# Patient Record
Sex: Female | Born: 1985 | Hispanic: Yes | Smoking: Former smoker
Health system: Southern US, Community
[De-identification: ages and names within clinical notes are randomized; demographics above are authoritative.]

## PROBLEM LIST (undated history)

## (undated) ENCOUNTER — Inpatient Hospital Stay (HOSPITAL_COMMUNITY): Payer: Self-pay

## (undated) DIAGNOSIS — O009 Unspecified ectopic pregnancy without intrauterine pregnancy: Secondary | ICD-10-CM

## (undated) DIAGNOSIS — D649 Anemia, unspecified: Secondary | ICD-10-CM

## (undated) DIAGNOSIS — E119 Type 2 diabetes mellitus without complications: Secondary | ICD-10-CM

## (undated) DIAGNOSIS — F419 Anxiety disorder, unspecified: Secondary | ICD-10-CM

## (undated) HISTORY — DX: Unspecified ectopic pregnancy without intrauterine pregnancy: O00.90

## (undated) HISTORY — DX: Type 2 diabetes mellitus without complications: E11.9

## (undated) HISTORY — PX: ABDOMINAL SURGERY: SHX537

---
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
## 2012-06-15 DIAGNOSIS — O24419 Gestational diabetes mellitus in pregnancy, unspecified control: Secondary | ICD-10-CM

## 2015-09-21 DIAGNOSIS — O009 Unspecified ectopic pregnancy without intrauterine pregnancy: Secondary | ICD-10-CM

## 2015-09-21 HISTORY — DX: Unspecified ectopic pregnancy without intrauterine pregnancy: O00.90

## 2016-09-20 HISTORY — PX: WISDOM TOOTH EXTRACTION: SHX21

## 2016-11-10 DIAGNOSIS — R58 Hemorrhage, not elsewhere classified: Secondary | ICD-10-CM

## 2017-04-06 ENCOUNTER — Inpatient Hospital Stay (HOSPITAL_COMMUNITY)
Admission: AD | Admit: 2017-04-06 | Discharge: 2017-04-06 | Disposition: A | Discharge status: Home / Self Care | Payer: Medicaid Other | Source: Ambulatory Visit | Attending: Family Medicine | Admitting: Family Medicine

## 2017-04-06 ENCOUNTER — Encounter (HOSPITAL_COMMUNITY): Payer: Self-pay

## 2017-04-06 DIAGNOSIS — Z3A01 Less than 8 weeks gestation of pregnancy: Secondary | ICD-10-CM | POA: Diagnosis not present

## 2017-04-06 DIAGNOSIS — O98811 Other maternal infectious and parasitic diseases complicating pregnancy, first trimester: Secondary | ICD-10-CM | POA: Insufficient documentation

## 2017-04-06 DIAGNOSIS — O4691 Antepartum hemorrhage, unspecified, first trimester: Secondary | ICD-10-CM | POA: Diagnosis not present

## 2017-04-06 DIAGNOSIS — B373 Candidiasis of vulva and vagina: Secondary | ICD-10-CM | POA: Diagnosis not present

## 2017-04-06 DIAGNOSIS — O209 Hemorrhage in early pregnancy, unspecified: Secondary | ICD-10-CM

## 2017-04-06 LAB — URINALYSIS, ROUTINE W REFLEX MICROSCOPIC
BILIRUBIN URINE: NEGATIVE
Glucose, UA: NEGATIVE mg/dL
Ketones, ur: NEGATIVE mg/dL
NITRITE: NEGATIVE
PH: 5 (ref 5.0–8.0)
Protein, ur: NEGATIVE mg/dL
SPECIFIC GRAVITY, URINE: 1.03 (ref 1.005–1.030)

## 2017-04-06 LAB — POCT PREGNANCY, URINE: Preg Test, Ur: POSITIVE — AB

## 2017-04-06 LAB — WET PREP, GENITAL
CLUE CELLS WET PREP: NONE SEEN
Sperm: NONE SEEN
Trich, Wet Prep: NONE SEEN

## 2017-04-06 LAB — HCG, QUANTITATIVE, PREGNANCY: hCG, Beta Chain, Quant, S: 17734 m[IU]/mL — ABNORMAL HIGH (ref <5)

## 2017-04-06 MED ORDER — FLUCONAZOLE 150 MG PO TABS: 150.0000 mg | ORAL_TABLET | Freq: Once | ORAL | 0 refills | Status: AC

## 2017-04-06 MED ORDER — VITAFOL ULTRA 29-0.6-0.4-200 MG PO CAPS: 1.0000 | ORAL_CAPSULE | Freq: Every day | ORAL | 12 refills | Status: DC

## 2017-04-06 NOTE — Discharge Instructions (Signed)

## 2017-04-06 NOTE — MAU Note (Signed)
Pt has had some bleeding on and off the last few days. Started again today and only when she wipes.

## 2017-04-06 NOTE — MAU Provider Note (Signed)
  History     CSN: [id number]  Arrival date and time: 04/06/17 1527   First Provider Initiated Contact with Patient 04/06/17 1612      Chief Complaint  Patient presents with  . Vaginal Bleeding   HPI 31 yo [id number] at 5 weeks by LMP here for evaluation of vaginal bleeding in first trimester. Patient reports that this has been going on and off over the past few days. She was seen at [facility] 2 days ago with the same complaints. She reports an ultrasound was performed which demonstrated an intrauterine pregnancy without a visible fetus. She reports vaginal intercourse yesterday. She states that although the vaginal bleeding was intermittent, it stopped but started again today. She describes it as light and only visible when she wipes. She also endorses some lower pelvic cramping pain. Patient has not started prenatal care yet. This was an unplanned pregnancy (result of failed IUD) but it is desired.  OB History    Gravida Para Term Preterm AB Living   7 3 3   3      SAB TAB Ectopic Multiple Live Births   2   1          Past Medical History:  Diagnosis Date  . Gestational diabetes   . Pregnancy induced hypertension     History reviewed. No pertinent surgical history.  History reviewed. No pertinent family history.  Social History  Substance Use Topics  . Smoking status: Never Smoker  . Smokeless tobacco: Never Used  . Alcohol use No    Allergies: Allergies not on file  No prescriptions prior to admission.    Review of Systems  See pertinent in HPI Physical Exam   Blood pressure 99/75, pulse 77, temperature 98 F (36.7 C), temperature source Oral, resp. rate 18, last menstrual period 02/06/2017, SpO2 100 %.  Physical Exam GENERAL: Well-developed, well-nourished female in no acute distress.  ABDOMEN: Soft, nontender, nondistended.  PELVIC: Normal external female genitalia. Vagina is pink and rugated.  Normal discharge. Normal appearing cervix. Uterus is  normal in size. No adnexal mass or tenderness. No blood in vaginal vault or active bleeding EXTREMITIES: No cyanosis, clubbing, or edema, 2+ distal pulses.  MAU Course  Procedures  MDM Wet prep- yeast infection Quant HCG  Assessment and Plan  31 yo [id number] with vaginal bleeding in first trimester - Reassurance provided - Quant HCG today 17700 (increased from her reported 9000 2 days ago) - outpatient ultrasound ordered in 7 days for viability - Rx diflucan and prenatal vitamins provided  Chandan Fly 04/06/2017, 4:19 PM

## 2017-04-07 LAB — GC/CHLAMYDIA PROBE AMP (~~LOC~~) NOT AT ARMC
Chlamydia: NEGATIVE
NEISSERIA GONORRHEA: NEGATIVE

## 2017-04-14 ENCOUNTER — Ambulatory Visit (HOSPITAL_COMMUNITY)
Admission: RE | Admit: 2017-04-14 | Discharge: 2017-04-14 | Disposition: A | Discharge status: Home / Self Care | Payer: Medicaid Other | Source: Ambulatory Visit | Attending: Obstetrics and Gynecology | Admitting: Obstetrics and Gynecology

## 2017-04-14 ENCOUNTER — Other Ambulatory Visit (HOSPITAL_COMMUNITY): Payer: Self-pay | Admitting: Obstetrics and Gynecology

## 2017-04-14 DIAGNOSIS — O209 Hemorrhage in early pregnancy, unspecified: Secondary | ICD-10-CM | POA: Diagnosis present

## 2017-04-14 DIAGNOSIS — Z3A01 Less than 8 weeks gestation of pregnancy: Secondary | ICD-10-CM | POA: Insufficient documentation

## 2017-04-15 ENCOUNTER — Telehealth: Payer: Self-pay | Admitting: *Deleted

## 2017-04-15 NOTE — Telephone Encounter (Signed)
Attempted to call patient at all numbers listed. Just heard busy tone repeatedly. Will attempt to call her again on Monday.

## 2017-04-15 NOTE — Telephone Encounter (Signed)
Patient called and stated that she had an ultrasound yesterday and would like the results.

## 2017-04-18 ENCOUNTER — Encounter: Payer: Self-pay | Admitting: General Practice

## 2017-05-05 ENCOUNTER — Encounter: Payer: Self-pay | Admitting: Certified Nurse Midwife

## 2017-05-05 ENCOUNTER — Ambulatory Visit (INDEPENDENT_AMBULATORY_CARE_PROVIDER_SITE_OTHER): Payer: Medicaid Other | Admitting: Certified Nurse Midwife

## 2017-05-05 VITALS — BP 114/75 | HR 63 | Ht 61.0 in | Wt 204.0 lb

## 2017-05-05 DIAGNOSIS — O0990 Supervision of high risk pregnancy, unspecified, unspecified trimester: Secondary | ICD-10-CM | POA: Insufficient documentation

## 2017-05-05 DIAGNOSIS — O0992 Supervision of high risk pregnancy, unspecified, second trimester: Secondary | ICD-10-CM | POA: Diagnosis not present

## 2017-05-05 DIAGNOSIS — O09292 Supervision of pregnancy with other poor reproductive or obstetric history, second trimester: Secondary | ICD-10-CM

## 2017-05-05 DIAGNOSIS — O219 Vomiting of pregnancy, unspecified: Secondary | ICD-10-CM

## 2017-05-05 DIAGNOSIS — O09291 Supervision of pregnancy with other poor reproductive or obstetric history, first trimester: Secondary | ICD-10-CM | POA: Insufficient documentation

## 2017-05-05 DIAGNOSIS — Z8632 Personal history of gestational diabetes: Secondary | ICD-10-CM | POA: Insufficient documentation

## 2017-05-05 MED ORDER — PRENATE PIXIE 10-0.6-0.4-200 MG PO CAPS: 1.0000 | ORAL_CAPSULE | Freq: Every day | ORAL | 12 refills | Status: DC

## 2017-05-05 MED ORDER — ASPIRIN 81 MG PO CHEW: 81.0000 mg | CHEWABLE_TABLET | Freq: Every day | ORAL | 12 refills | Status: DC

## 2017-05-05 MED ORDER — DOXYLAMINE-PYRIDOXINE 10-10 MG PO TBEC: DELAYED_RELEASE_TABLET | ORAL | 4 refills | Status: DC

## 2017-05-05 NOTE — Progress Notes (Signed)
Anitbody S

## 2017-05-05 NOTE — Progress Notes (Signed)
Subjective:    Jamie Riggs is being seen today for her first obstetrical visit.  This is not a planned pregnancy, Had IUD that fell out at start of pregnancy. She is at 6231w2d gestation. Her obstetrical history is significant for obesity and Pre-eclampsia, GDM, close spaced pregnancies, antibody S.  Hx of ectopic pregnancy, has US with IUP in records. Relationship with FOB: spouse, living together. Patient does intend to breast feed. Pregnancy history fully reviewed.  The information documented in the HPI was reviewed and verified.  Menstrual History: OB History    Gravida Para Term Preterm AB Living   6 3 3   2 3    SAB TAB Ectopic Multiple Live Births   1   1   3        Patient's last menstrual period was 02/22/2017.    Past Medical History:  Diagnosis Date  . Ectopic pregnancy 2017  . Gestational diabetes   . Pregnancy induced hypertension     Past Surgical History:  Procedure Laterality Date  . WISDOM TOOTH EXTRACTION  2018     (Not in a hospital admission) Not on File  Social History  Substance Use Topics  . Smoking status: Never Smoker  . Smokeless tobacco: Never Used  . Alcohol use No    Family History  Problem Relation Age of Onset  . Diabetes Mother   . Hypertension Father   . Diabetes Maternal Grandmother   . Cancer Paternal Grandmother      Review of Systems Constitutional: negative for weight loss Gastrointestinal: negative for vomiting, + nausea  Genitourinary:negative for genital lesions and vaginal discharge and dysuria Musculoskeletal:negative for back pain Behavioral/Psych: negative for abusive relationship, depression, illegal drug usage and tobacco use    Objective:    BP 114/75   Pulse 63   Ht 5\' 1"  (1.549 m)   Wt 204 lb (92.5 kg)   LMP 02/22/2017   BMI 38.55 kg/m  General Appearance:    Alert, cooperative, no distress, appears stated age  Head:    Normocephalic, without obvious abnormality, atraumatic  Eyes:    PERRL,  conjunctiva/corneas clear, EOM's intact, fundi    benign, both eyes  Ears:    Normal TM's and external ear canals, both ears  Nose:   Nares normal, septum midline, mucosa normal, no drainage    or sinus tenderness  Throat:   Lips, mucosa, and tongue normal; teeth and gums normal  Neck:   Supple, symmetrical, trachea midline, no adenopathy;    thyroid:  no enlargement/tenderness/nodules; no carotid   bruit or JVD  Back:     Symmetric, no curvature, ROM normal, no CVA tenderness  Lungs:     Clear to auscultation bilaterally, respirations unlabored  Chest Wall:    No tenderness or deformity   Heart:    Regular rate and rhythm, S1 and S2 normal, no murmur, rub   or gallop  Breast Exam:    No tenderness, masses, or nipple abnormality  Abdomen:     Soft, non-tender, bowel sounds active all four quadrants,    no masses, no organomegaly  Genitalia:    Normal female without lesion, discharge or tenderness  Extremities:   Extremities normal, atraumatic, no cyanosis or edema  Pulses:   2+ and symmetric all extremities  Skin:   Skin color, texture, turgor normal, no rashes or lesions  Lymph nodes:   Cervical, supraclavicular, and axillary nodes normal  Neurologic:   CNII-XII intact, normal strength, sensation and reflexes  throughout           Lab Review Urine pregnancy test Labs reviewed yes Radiologic studies reviewed yes Assessment:    Pregnancy at [redacted]w[redacted]d weeks    Plan:      Prenatal vitamins.  Counseling provided regarding continued use of seat belts, cessation of alcohol consumption, smoking or use of illicit drugs; infection precautions i.e., influenza/TDAP immunizations, toxoplasmosis,CMV, parvovirus, listeria and varicella; workplace safety, exercise during pregnancy; routine dental care, safe medications, sexual activity, hot tubs, saunas, pools, travel, caffeine use, fish and methlymercury, potential toxins, hair treatments, varicose veins Weight gain recommendations per IOM  guidelines reviewed: underweight/BMI< 18.5--> gain 28 - 40 lbs; normal weight/BMI 18.5 - 24.9--> gain 25 - 35 lbs; overweight/BMI 25 - 29.9--> gain 15 - 25 lbs; obese/BMI >30->gain  11 - 20 lbs Problem list reviewed and updated. FIRST/CF mutation testing/NIPT/QUAD SCREEN/fragile X/Ashkenazi Jewish population testing/Spinal muscular atrophy discussed: requested. Role of ultrasound in pregnancy discussed; fetal survey: ordered. Amniocentesis discussed: not discussed.  Meds ordered this encounter  Medications  . aspirin 81 MG chewable tablet    Sig: Chew 1 tablet (81 mg total) by mouth daily.    Dispense:  30 tablet    Refill:  12  . Prenat-FeAsp-Meth-FA-DHA w/o A (PRENATE PIXIE) 10-0.6-0.4-200 MG CAPS    Sig: Take 1 tablet by mouth daily.    Dispense:  30 capsule    Refill:  12    Please process coupon: Rx BIN: [id number], RxPCN: [id number], RxGRP: [id number], RxID: [id number]  SUF: 01  . Doxylamine-Pyridoxine (DICLEGIS) 10-10 MG TBEC    Sig: Take 1 tablet with breakfast and lunch.  Take 2 tablets at bedtime.    Dispense:  100 tablet    Refill:  4   Orders Placed This Encounter  Procedures  . Culture, OB Urine  . [place] MFM Fetal Nuchal Translucency    Standing Status:   Future    Standing Expiration Date:   07/05/2018    Order Specific Question:   Reason for Exam (SYMPTOM  OR DIAGNOSIS REQUIRED)    Answer:   Anti S, hx of Pre-e and GDM    Order Specific Question:   Preferred imaging location?    Answer:   [facility]  . [place] MFM OB DETAIL +14 WK    Standing Status:   Future    Standing Expiration Date:   07/05/2018    Order Specific Question:   Reason for Exam (SYMPTOM  OR DIAGNOSIS REQUIRED)    Answer:   fetal anatomy scan    Order Specific Question:   Preferred imaging location?    Answer:   [facility]  . Hemoglobinopathy evaluation  . Varicella zoster antibody, IgG  . Hemoglobin A1c  . Obstetric Panel, Including HIV  . Cystic Fibrosis Mutation 97  . AMB referral to maternal  fetal medicine    Referral Priority:   Routine    Referral Type:   Consultation    Referral Reason:   Specialty Services Required    Number of Visits Requested:   1  . AMB MFM GENETICS REFERRAL    Referral Priority:   Routine    Referral Type:   Consultation    Referral Reason:   Specialty Services Required    Number of Visits Requested:   1    Follow up in 4 weeks. 50% of 30 min visit spent on counseling and coordination of care.

## 2017-05-07 LAB — CULTURE, OB URINE

## 2017-05-07 LAB — URINE CULTURE, OB REFLEX

## 2017-05-12 ENCOUNTER — Other Ambulatory Visit: Payer: Self-pay | Admitting: Certified Nurse Midwife

## 2017-05-12 DIAGNOSIS — O0990 Supervision of high risk pregnancy, unspecified, unspecified trimester: Secondary | ICD-10-CM

## 2017-05-12 LAB — CYSTIC FIBROSIS MUTATION 97: Interpretation: NOT DETECTED

## 2017-05-17 ENCOUNTER — Other Ambulatory Visit: Payer: Self-pay | Admitting: Certified Nurse Midwife

## 2017-05-17 DIAGNOSIS — R7689 Other specified abnormal immunological findings in serum: Secondary | ICD-10-CM | POA: Insufficient documentation

## 2017-05-17 DIAGNOSIS — D649 Anemia, unspecified: Secondary | ICD-10-CM | POA: Insufficient documentation

## 2017-05-17 DIAGNOSIS — R768 Other specified abnormal immunological findings in serum: Secondary | ICD-10-CM | POA: Insufficient documentation

## 2017-05-17 LAB — OBSTETRIC PANEL, INCLUDING HIV
BASOS: 0 %
Basophils Absolute: 0 10*3/uL (ref 0.0–0.2)
EOS (ABSOLUTE): 0.1 10*3/uL (ref 0.0–0.4)
EOS: 1 %
HEMATOCRIT: 35.1 % (ref 34.0–46.6)
HEMOGLOBIN: 10.5 g/dL — AB (ref 11.1–15.9)
HIV SCREEN 4TH GENERATION: NONREACTIVE
Hepatitis B Surface Ag: NEGATIVE
IMMATURE GRANULOCYTES: 0 %
Immature Grans (Abs): 0 10*3/uL (ref 0.0–0.1)
LYMPHS ABS: 2.8 10*3/uL (ref 0.7–3.1)
LYMPHS: 30 %
MCH: 23.3 pg — ABNORMAL LOW (ref 26.6–33.0)
MCHC: 29.9 g/dL — AB (ref 31.5–35.7)
MCV: 78 fL — ABNORMAL LOW (ref 79–97)
MONOCYTES: 6 %
MONOS ABS: 0.5 10*3/uL (ref 0.1–0.9)
Neutrophils Absolute: 5.9 10*3/uL (ref 1.4–7.0)
Neutrophils: 63 %
Platelets: 320 10*3/uL (ref 150–379)
RBC: 4.51 x10E6/uL (ref 3.77–5.28)
RDW: 20.1 % — ABNORMAL HIGH (ref 12.3–15.4)
RH TYPE: POSITIVE
RPR Ser Ql: NONREACTIVE
Rubella Antibodies, IGG: 2.67 index (ref >0.99)
WBC: 9.4 10*3/uL (ref 3.4–10.8)

## 2017-05-17 LAB — VARICELLA ZOSTER ANTIBODY, IGG: Varicella zoster IgG: 1736 index (ref >165)

## 2017-05-17 LAB — HEMOGLOBIN A1C
Est. average glucose Bld gHb Est-mCnc: 114 mg/dL
HEMOGLOBIN A1C: 5.6 % (ref 4.8–5.6)

## 2017-05-17 LAB — HEMOGLOBINOPATHY EVALUATION
HEMOGLOBIN A2 QUANTITATION: 1.4 % — AB (ref 1.8–3.2)
HEMOGLOBIN F QUANTITATION: 0 % (ref 0.0–2.0)
HGB A: 98.6 % (ref 96.4–98.8)
HGB C: 0 %
HGB S: 0 %
HGB VARIANT: 0 %

## 2017-05-17 LAB — AB SCR+ANTIBODY ID: Antibody Screen: POSITIVE — AB

## 2017-05-27 ENCOUNTER — Encounter (HOSPITAL_COMMUNITY): Payer: Medicaid Other

## 2017-05-27 ENCOUNTER — Other Ambulatory Visit: Payer: Self-pay | Admitting: Certified Nurse Midwife

## 2017-05-27 ENCOUNTER — Ambulatory Visit (HOSPITAL_COMMUNITY)
Admission: RE | Admit: 2017-05-27 | Discharge: 2017-05-27 | Disposition: A | Discharge status: Home / Self Care | Payer: Medicaid Other | Source: Ambulatory Visit | Attending: Obstetrics & Gynecology | Admitting: Obstetrics & Gynecology

## 2017-05-27 ENCOUNTER — Encounter (HOSPITAL_COMMUNITY): Payer: Self-pay

## 2017-05-27 ENCOUNTER — Ambulatory Visit (HOSPITAL_COMMUNITY)
Admission: RE | Admit: 2017-05-27 | Discharge: 2017-05-27 | Disposition: A | Discharge status: Home / Self Care | Payer: Medicaid Other | Source: Ambulatory Visit | Attending: Certified Nurse Midwife | Admitting: Certified Nurse Midwife

## 2017-05-27 DIAGNOSIS — O361910 Maternal care for other isoimmunization, first trimester, not applicable or unspecified: Secondary | ICD-10-CM | POA: Insufficient documentation

## 2017-05-27 DIAGNOSIS — E669 Obesity, unspecified: Secondary | ICD-10-CM | POA: Diagnosis not present

## 2017-05-27 DIAGNOSIS — O99211 Obesity complicating pregnancy, first trimester: Secondary | ICD-10-CM

## 2017-05-27 DIAGNOSIS — Z3682 Encounter for antenatal screening for nuchal translucency: Secondary | ICD-10-CM

## 2017-05-27 DIAGNOSIS — Z8632 Personal history of gestational diabetes: Principal | ICD-10-CM

## 2017-05-27 DIAGNOSIS — O09291 Supervision of pregnancy with other poor reproductive or obstetric history, first trimester: Secondary | ICD-10-CM | POA: Diagnosis not present

## 2017-05-27 DIAGNOSIS — Z3A13 13 weeks gestation of pregnancy: Secondary | ICD-10-CM | POA: Insufficient documentation

## 2017-05-27 DIAGNOSIS — O361110 Maternal care for Anti-A sensitization, first trimester, not applicable or unspecified: Secondary | ICD-10-CM

## 2017-05-27 DIAGNOSIS — O0990 Supervision of high risk pregnancy, unspecified, unspecified trimester: Secondary | ICD-10-CM

## 2017-05-27 NOTE — Progress Notes (Signed)
Maternal Fetal Medicine Consultation  Requesting Provider(s):Denney, CNM Primary OB:[facility] Reason for consultation: History of Anti-S isoimmunization, gestational diabetes and preeclampsia in previous pregnancies  HPI:31yo P3023 at 13+3 weeks referred for the above problems. She had gestational diabetes during her second term pregnancy, with a baby that was born at 7 pounds 12 ounces and had no neonatal hypoglycemia. In her third term pregnancy (that delivered c. 7 months ago) there was no GDM. She had a recent HgbA1C which was 5.6% During her first pregnancy she had term preeclampsia that was mild. She did not have preeclampsia in her second and third term pregnancies. This pregnancy is with a new partner. She has already started low-dose aspirin. During her last pregnancy she had a positive antibody screen with Anti-S identified. Titers were never greater than 1, and the baby had neither hydrops prenatally nor hyperbilirubinemia postnatally. Her labs from [facility] showed a positve antibody screen but the antibody ID was nonspecific. OB History: OB History    Gravida Para Term Preterm AB Living   6 3 3   2 3    SAB TAB Ectopic Multiple Live Births   1   1   3       PMH:  Past Medical History:  Diagnosis Date  . Ectopic pregnancy 2017  . Gestational diabetes   . Pregnancy induced hypertension     PSH:  Past Surgical History:  Procedure Laterality Date  . WISDOM TOOTH EXTRACTION  2018   Meds: low-dose ASA, PNV Allergies: NKDA FH: See EPIC section Soc: See EPIC section  Review of Systems: no vaginal bleeding or cramping/contractions, no LOF, no nausea/vomiting. All other systems reviewed and are negative.  PE:  VS: See EPIC section GEN: well-appearing female ABD: gravid, NT  Please see separate document for fetal ultrasound report.  A/P: 1. History of preeclampsia in a previous pregnancy. This follows the classic pattern for nonrecurrent preeclampsia: mild, at term, and  occurring in a primigravida. I agree with starting the aspirin. I do not believe baseline labs are necessary. She does have a slightly higher risk for recurrence that what we would expect in this situation due to the "reset" effect seen with a new FOB. 2. History of gestational diabetes ina previous pregnancy. The normal HgbA1C and the fact that she did not have GDM in her last pregnancy reassures me that we do not need early intervention. I would suggest getting her 1 hour GTT at 26 weeks 3. 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 The most important task is identification of the antibody and obtaining titers. We will have blood drawn and sent to blood bank today. If titers remain <8, then no intervention will be necessary as she has had no previously affected children. To this end, if initial antibody ID and titers are reassuring, then I would get titers drawn q4 weeks during pregnancy. The need to be sent to the same lab for evaluation, in this case the [facility] bank. At this time no further visits have been scheduled in MFM. We will contact you with the results of the antibody titers and first trimester screen labs as soon as they are available  Thank you for the opportunity to be a part of the care of Jamie Riggs. Please contact our office if we can be of further assistance.   I spent approximately 30 minutes with this patient with over 50% of time spent in face-to-face counseling.

## 2017-05-27 NOTE — Addendum Note (Signed)
Encounter addended by: Levonne HubertStalter, Sady Monaco M on: 05/27/2017 11:33 AM<BR>    Actions taken: Imaging Exam ended

## 2017-05-31 LAB — SPECIMEN STATUS REPORT

## 2017-05-31 LAB — ANTIBODY IDENTIFICATION

## 2017-06-04 ENCOUNTER — Other Ambulatory Visit: Payer: Self-pay | Admitting: Certified Nurse Midwife

## 2017-06-04 DIAGNOSIS — O0990 Supervision of high risk pregnancy, unspecified, unspecified trimester: Secondary | ICD-10-CM

## 2017-06-08 ENCOUNTER — Telehealth: Payer: Self-pay | Admitting: *Deleted

## 2017-06-08 ENCOUNTER — Other Ambulatory Visit: Payer: Self-pay | Admitting: Certified Nurse Midwife

## 2017-06-08 NOTE — Progress Notes (Signed)
Discussed lab results with patient and antibody identification.  Had antibody S with hemorrhage last pregnancy but did not require a blood transfusion.   R.Naji Mehringer CNM

## 2017-06-08 NOTE — Telephone Encounter (Signed)
Patient is calling about her results- she doesn't understand the comments and would like Rachelle to explain them to her.

## 2017-06-08 NOTE — Telephone Encounter (Signed)
Spoke with patient, see phone note.

## 2017-06-15 ENCOUNTER — Encounter: Payer: Self-pay | Admitting: Obstetrics & Gynecology

## 2017-06-15 ENCOUNTER — Ambulatory Visit (INDEPENDENT_AMBULATORY_CARE_PROVIDER_SITE_OTHER): Payer: Medicaid Other | Admitting: Obstetrics & Gynecology

## 2017-06-15 DIAGNOSIS — O09292 Supervision of pregnancy with other poor reproductive or obstetric history, second trimester: Secondary | ICD-10-CM

## 2017-06-15 DIAGNOSIS — O0990 Supervision of high risk pregnancy, unspecified, unspecified trimester: Secondary | ICD-10-CM

## 2017-06-15 DIAGNOSIS — O09299 Supervision of pregnancy with other poor reproductive or obstetric history, unspecified trimester: Secondary | ICD-10-CM | POA: Insufficient documentation

## 2017-06-15 DIAGNOSIS — O0992 Supervision of high risk pregnancy, unspecified, second trimester: Secondary | ICD-10-CM

## 2017-06-15 NOTE — Progress Notes (Signed)
   PRENATAL VISIT NOTE  Subjective:  Jamie Riggs is a 31 y.o. [phone] at [redacted]w[redacted]d being seen today for ongoing prenatal care.  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  Patient reports nausea.  Contractions: Not present. Vag. Bleeding: None.  Movement: Present. Denies leaking of fluid.   The following portions of the patient's history were reviewed and updated as appropriate: allergies, current medications, past family history, past medical history, past social history, past surgical history and problem list. Problem list updated.  Objective:   Vitals:   06/15/17 1332  BP: 123/75  Pulse: 92  Weight: 209 lb (94.8 kg)    Fetal Status: Fetal Heart Rate (bpm): 148   Movement: Present     General:  Alert, oriented and cooperative. Patient is in no acute distress.  Skin: Skin is warm and dry. No rash noted.   Cardiovascular: Normal heart rate noted  Respiratory: Normal respiratory effort, no problems with respiration noted  Abdomen: Soft, gravid, appropriate for gestational age.  Pain/Pressure: Absent     Pelvic: Cervical exam deferred        Extremities: Normal range of motion.     Mental Status:  Normal mood and affect. Normal behavior. Normal judgment and thought content.   Assessment and Plan:  Pregnancy: [id number] at [redacted]w[redacted]d  1. Supervision of high risk pregnancy, antepartum Routine screen - AFP, Serum, Open Spina Bifida  2. H/O postpartum hemorrhage, currently pregnant Has abnormal Ab screen - AFP, Serum, Open Spina Bifida  Preterm labor symptoms and general obstetric precautions including  but not limited to vaginal bleeding, contractions, leaking of fluid and fetal movement were reviewed in detail with the patient. Please refer to After Visit Summary for other counseling recommendations.  Return in about 4 weeks (around 07/13/2017).   Scheryl Darter, MD

## 2017-06-15 NOTE — Patient Instructions (Signed)

## 2017-06-20 LAB — AFP, SERUM, OPEN SPINA BIFIDA
AFP MOM: 0.79
AFP VALUE AFPOSL: 21.5 ng/mL
Gest. Age on Collection Date: 16.1 weeks
Maternal Age At EDD: 31.1 yr
OSBR RISK 1 IN: 10000
Test Results:: NEGATIVE
WEIGHT: 209 [lb_av]

## 2017-06-22 ENCOUNTER — Other Ambulatory Visit: Payer: Self-pay

## 2017-06-24 ENCOUNTER — Ambulatory Visit (INDEPENDENT_AMBULATORY_CARE_PROVIDER_SITE_OTHER): Payer: Medicaid Other | Admitting: Certified Nurse Midwife

## 2017-06-24 VITALS — BP 116/73 | HR 62 | Wt 209.0 lb

## 2017-06-24 DIAGNOSIS — R399 Unspecified symptoms and signs involving the genitourinary system: Secondary | ICD-10-CM

## 2017-06-24 DIAGNOSIS — O0990 Supervision of high risk pregnancy, unspecified, unspecified trimester: Secondary | ICD-10-CM

## 2017-06-24 DIAGNOSIS — O26892 Other specified pregnancy related conditions, second trimester: Secondary | ICD-10-CM

## 2017-06-24 DIAGNOSIS — R102 Pelvic and perineal pain: Secondary | ICD-10-CM

## 2017-06-24 DIAGNOSIS — M549 Dorsalgia, unspecified: Secondary | ICD-10-CM

## 2017-06-24 DIAGNOSIS — O9989 Other specified diseases and conditions complicating pregnancy, childbirth and the puerperium: Secondary | ICD-10-CM

## 2017-06-24 DIAGNOSIS — O26899 Other specified pregnancy related conditions, unspecified trimester: Secondary | ICD-10-CM

## 2017-06-24 DIAGNOSIS — O0992 Supervision of high risk pregnancy, unspecified, second trimester: Secondary | ICD-10-CM

## 2017-06-24 DIAGNOSIS — D649 Anemia, unspecified: Secondary | ICD-10-CM

## 2017-06-24 DIAGNOSIS — O99891 Other specified diseases and conditions complicating pregnancy: Secondary | ICD-10-CM

## 2017-06-24 MED ORDER — COMFORT FIT MATERNITY SUPP LG MISC: 1.0000 [IU] | Freq: Every day | 0 refills | Status: DC

## 2017-06-24 NOTE — Progress Notes (Signed)
Presents for lower back and abdominal pain, she is [redacted] weeks pregnant.  Denies fever, chills and vomiting. She sometimes get lightheaded and nausea. Burns whenever she pees.

## 2017-06-27 ENCOUNTER — Telehealth: Payer: Self-pay | Admitting: *Deleted

## 2017-06-27 ENCOUNTER — Encounter (HOSPITAL_COMMUNITY): Payer: Self-pay | Admitting: Certified Nurse Midwife

## 2017-06-27 ENCOUNTER — Other Ambulatory Visit: Payer: Self-pay | Admitting: Certified Nurse Midwife

## 2017-06-27 DIAGNOSIS — N949 Unspecified condition associated with female genital organs and menstrual cycle: Secondary | ICD-10-CM

## 2017-06-27 MED ORDER — CYCLOBENZAPRINE HCL 10 MG PO TABS: 10.0000 mg | ORAL_TABLET | Freq: Three times a day (TID) | ORAL | 1 refills | Status: DC | PRN

## 2017-06-27 NOTE — Telephone Encounter (Signed)
Patient called in ans stated she came in on Friday and was prescribed something for pain and when she went to the pharmacy nothing was there...please advise.Marland KitchenMarland Kitchen

## 2017-06-27 NOTE — Progress Notes (Signed)
   PRENATAL VISIT NOTE  Subjective:  Jamie Riggs is a 31 y.o. [phone] at [redacted]w[redacted]d being seen today for ongoing prenatal care.  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  Patient reports backache, no bleeding, no contractions, no cramping and no leaking.  Contractions: Not present. Vag. Bleeding: None.  Movement: Present. Denies leaking of fluid.   The following portions of the patient's history were reviewed and updated as appropriate: allergies, current medications, past family history, past medical history, past social history, past surgical history and problem list. Problem list updated.  Objective:   Vitals:   06/24/17 0941  BP: 116/73  Pulse: 62  Weight: 209 lb (94.8 kg)    Fetal Status: Fetal Heart Rate (bpm): 150; doppler   Movement: Present     General:  Alert, oriented and cooperative. Patient is in no acute distress.  Skin: Skin is warm and dry. No rash noted.   Cardiovascular: Normal heart rate noted  Respiratory: Normal respiratory effort, no problems with respiration noted  Abdomen: Soft, gravid, appropriate for gestational age.  Pain/Pressure: Present     Pelvic: Cervical exam deferred        Extremities: Normal range of motion.  Edema: Trace  Mental Status:  Normal mood and affect. Normal behavior. Normal judgment and thought content.   Assessment and Plan:  Pregnancy: [id number] at [redacted]w[redacted]d  1. UTI symptoms      - POCT Urinalysis Dipstick - Culture, OB Urine  2. Supervision of high risk pregnancy, antepartum       3. Low hemoglobin A2 level in blood     - Antibody  identification; Future - Antibody identification  4. Pain of round ligament during pregnancy     - Elastic Bandages & Supports (COMFORT FIT MATERNITY SUPP LG) MISC; 1 Units by Does not apply route daily.  Dispense: 1 each; Refill: 0  5. Back pain affecting pregnancy in second trimester    Homeopathic remedies discussed.  - Elastic Bandages & Supports (COMFORT FIT MATERNITY SUPP LG) MISC; 1 Units by Does not apply route daily.  Dispense: 1 each; Refill: 0  Preterm labor symptoms and general obstetric precautions including but not limited to vaginal bleeding, contractions, leaking of fluid and fetal movement were reviewed in detail with the patient. Please refer to After Visit Summary for other counseling recommendations.  Return in about 4 weeks (around 07/22/2017) for [facility].   Roe Coombs, CNM

## 2017-06-27 NOTE — Telephone Encounter (Signed)
Pt informed

## 2017-06-27 NOTE — Telephone Encounter (Signed)
Flexeril was sent to the pharmacy for her to try thank you.  Kinesha Auten

## 2017-06-28 LAB — CULTURE, OB URINE

## 2017-06-28 LAB — URINE CULTURE, OB REFLEX

## 2017-06-30 ENCOUNTER — Other Ambulatory Visit: Payer: Self-pay | Admitting: Certified Nurse Midwife

## 2017-06-30 DIAGNOSIS — R768 Other specified abnormal immunological findings in serum: Secondary | ICD-10-CM

## 2017-06-30 LAB — ANTIBODY IDENTIFICATION

## 2017-07-05 ENCOUNTER — Other Ambulatory Visit: Payer: Self-pay | Admitting: Certified Nurse Midwife

## 2017-07-05 ENCOUNTER — Other Ambulatory Visit (HOSPITAL_COMMUNITY): Payer: Self-pay | Admitting: *Deleted

## 2017-07-05 ENCOUNTER — Encounter (HOSPITAL_COMMUNITY): Payer: Self-pay

## 2017-07-05 ENCOUNTER — Ambulatory Visit (HOSPITAL_COMMUNITY)
Admission: RE | Admit: 2017-07-05 | Discharge: 2017-07-05 | Disposition: A | Discharge status: Home / Self Care | Payer: Medicaid Other | Source: Ambulatory Visit | Attending: Certified Nurse Midwife | Admitting: Certified Nurse Midwife

## 2017-07-05 DIAGNOSIS — O09892 Supervision of other high risk pregnancies, second trimester: Secondary | ICD-10-CM

## 2017-07-05 DIAGNOSIS — Z8632 Personal history of gestational diabetes: Secondary | ICD-10-CM

## 2017-07-05 DIAGNOSIS — O99212 Obesity complicating pregnancy, second trimester: Secondary | ICD-10-CM

## 2017-07-05 DIAGNOSIS — O0992 Supervision of high risk pregnancy, unspecified, second trimester: Secondary | ICD-10-CM | POA: Diagnosis present

## 2017-07-05 DIAGNOSIS — O09292 Supervision of pregnancy with other poor reproductive or obstetric history, second trimester: Secondary | ICD-10-CM

## 2017-07-05 DIAGNOSIS — IMO0002 Reserved for concepts with insufficient information to code with codable children: Secondary | ICD-10-CM

## 2017-07-05 DIAGNOSIS — O0990 Supervision of high risk pregnancy, unspecified, unspecified trimester: Secondary | ICD-10-CM

## 2017-07-05 DIAGNOSIS — E669 Obesity, unspecified: Secondary | ICD-10-CM | POA: Diagnosis not present

## 2017-07-05 DIAGNOSIS — Z3689 Encounter for other specified antenatal screening: Secondary | ICD-10-CM | POA: Diagnosis not present

## 2017-07-05 DIAGNOSIS — Z3A19 19 weeks gestation of pregnancy: Secondary | ICD-10-CM | POA: Diagnosis not present

## 2017-07-05 DIAGNOSIS — Z0489 Encounter for examination and observation for other specified reasons: Secondary | ICD-10-CM

## 2017-07-13 ENCOUNTER — Ambulatory Visit (INDEPENDENT_AMBULATORY_CARE_PROVIDER_SITE_OTHER): Payer: Medicaid Other | Admitting: Obstetrics & Gynecology

## 2017-07-13 VITALS — BP 112/71 | HR 71 | Wt 211.0 lb

## 2017-07-13 DIAGNOSIS — Z23 Encounter for immunization: Secondary | ICD-10-CM

## 2017-07-13 DIAGNOSIS — O09292 Supervision of pregnancy with other poor reproductive or obstetric history, second trimester: Secondary | ICD-10-CM

## 2017-07-13 DIAGNOSIS — O0990 Supervision of high risk pregnancy, unspecified, unspecified trimester: Secondary | ICD-10-CM

## 2017-07-13 DIAGNOSIS — O0992 Supervision of high risk pregnancy, unspecified, second trimester: Secondary | ICD-10-CM

## 2017-07-13 DIAGNOSIS — O09291 Supervision of pregnancy with other poor reproductive or obstetric history, first trimester: Secondary | ICD-10-CM

## 2017-07-13 NOTE — Progress Notes (Signed)
PRENATAL VISIT NOTE  Subjective:  Jamie Riggs is a 31 y.o. [phone] at [redacted]w[redacted]d being seen today for ongoing prenatal care.  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  Patient reports no complaints.  Contractions: Not present. Vag. Bleeding: None.  Movement: Present. Denies leaking of fluid.   The following portions of the patient's history were reviewed and updated as appropriate: allergies, current medications, past family history, past medical history, past social history, past surgical history and problem list. Problem list updated.  Objective:   Vitals:   07/13/17 0826  BP: 112/71  Pulse: 71  Weight: 211 lb (95.7 kg)    Fetal Status: Fetal Heart Rate (bpm): 147   Movement: Present     General:  Alert, oriented and cooperative. Patient is in no acute distress.  Skin: Skin is warm and dry. No rash noted.   Cardiovascular: Normal heart rate noted  Respiratory: Normal respiratory effort, no problems with respiration noted  Abdomen: Soft, gravid, appropriate for gestational age.  Pain/Pressure: Present     Pelvic: Cervical exam deferred        Extremities: Normal range of motion.  Edema: Trace  Mental Status:  Normal mood and affect. Normal behavior. Normal judgment and thought content.   [place] Mfm Ob Detail +14 Wk  Result Date: 07/05/2017 ----------------------------------------------------------------------  OBSTETRICS REPORT                      (Signed Final 07/05/2017 10:10 am) ---------------------------------------------------------------------- Patient Info  ID #:       [id number]                           D.O.B.:  June 29, 1986 (31 yrs)  Name:       Jamie Riggs                Visit Date: 07/05/2017 09:03 am ---------------------------------------------------------------------- Performed By  Performed By:     Hurman Horn          Ref. Address:     [facility]                    [facility]                                                             [facility]                                                             [address]                                                             [place], [place]                                                             [address]  Attending:        Ledon Snare MD         Location:         [facility]  Referred By:      [facility] for                    [facility]                    Healthcare ---------------------------------------------------------------------- Orders   #  Description                                 Code   1  [place] MFM OB DETAIL +14 WK                     46962.95  ----------------------------------------------------------------------   #  Ordered By               Order #        Accession #    Episode #   1  RACHELLE Marjo Bicker          [id number]      [id number]     [id number]  ---------------------------------------------------------------------- Indications   [redacted] weeks gestation of pregnancy                Z3A.19   Poor obstetric history: Previous gestational   O09.299   diabetes/PIH   Obesity complicating pregnancy, second         O99.212   trimester   Encounter for fetal anatomic survey            Z36.89   Maternal care for other isoimmunization,       O36.1920   second trimester, unspecified (anti-S)   Short interval between pregancies, 2nd         O09.892   trimester  ---------------------------------------------------------------------- OB History  Blood Type:            Height:  5'1"   Weight (lb):  198       BMI:  37.41  Gravidity:    6          Term:   3        Prem:   0        SAB:   1  TOP:          0       Ectopic:  1        Living: 3 ---------------------------------------------------------------------- Fetal Evaluation  Num Of Fetuses:     1  Fetal Heart  153  Rate(bpm):  Cardiac Activity:   Observed  Presentation:       Variable  Placenta:           Posterior, above cervical os  P. Cord Insertion:  Visualized, central  Amniotic Fluid  AFI FV:      Subjectively within normal limits                              Largest Pocket(cm)                              3.5 ---------------------------------------------------------------------- Biometry  BPD:      43.1  mm     G. Age:  19w 0d         52  %    CI:        72.66   %    70 - 86                                                          FL/HC:      17.5   %    16.1 - 18.3  HC:      160.8  mm     G. Age:  18w 6d         38  %    HC/AC:      1.09        1.09 - 1.39  AC:      147.4  mm     G. Age:  20w 0d         78  %    FL/BPD:     65.2   %  FL:       28.1  mm     G. Age:  18w 4d         30  %    FL/AC:      19.1   %    20 - 24  CER:      19.4  mm     G. Age:  18w 5d         41  %  NFT:       8.3  mm  CM:          6  mm  Est. FW:     285  gm    0 lb 10 oz      50  % ---------------------------------------------------------------------- Gestational Age  LMP:           19w 0d        Date:  02/22/17                 EDD:   11/29/17  U/S Today:     19w 1d                                        EDD:   11/28/17  Best:          19w 0d     Det. By:  LMP  (02/22/17)          EDD:   11/29/17 ----------------------------------------------------------------------  Anatomy  Cranium:               Appears normal         Aortic Arch:            Not well visualized  Cavum:                 Appears normal         Ductal Arch:            Appears normal  Ventricles:            Appears normal         Diaphragm:              Appears normal  Choroid Plexus:        Appears normal         Stomach:                Appears  normal, left                                                                        sided  Cerebellum:            Appears normal         Abdomen:                Appears normal  Posterior Fossa:       Not well visualized    Abdominal Wall:         Appears nml (cord                                                                        insert, abd wall)  Nuchal Fold:           Not well visualized    Cord Vessels:           Appears normal (3                                                                        vessel cord)  Face:                  Appears normal         Kidneys:                Appear normal                         (orbits and profile)  Lips:                  Not well visualized    Bladder:  Appears normal  Thoracic:              Appears normal         Spine:                  Appears normal  Heart:                 Not well visualized    Upper Extremities:      Appears normal  RVOT:                  Appears normal         Lower Extremities:      Appears normal  LVOT:                  Appears normal  Other:  Fetus appears to be a female. Nasal bone visualized.Technically          difficult due to maternal habitus. ---------------------------------------------------------------------- Cervix Uterus Adnexa  Cervix  Length:           5.32  cm.  Normal appearance by transabdominal scan.  Uterus  No abnormality visualized.  Left Ovary  Not visualized.  Right Ovary  Not visualized.  Adnexa:       No abnormality visualized. ---------------------------------------------------------------------- Impression  Singleton intrauterine pregnancy at 19 weeks 0 days  gestation with fetal cardiac activity  Variable presentation  Posterior placenta without evidence of previa  Normal appearing fetal growth and amniotic fluid volume  Limited fetal anatomic survey secondary to maternal habitus  Normal appearing cervical length ---------------------------------------------------------------------- Recommendations   Recommend follow-up ultrasound examination in  4 weeks  to complete fetal anatomic survey ----------------------------------------------------------------------                   Ledon SnareBrian Brost, MD Electronically Signed Final Report   07/05/2017 10:10 am ----------------------------------------------------------------------   Assessment and Plan:  Pregnancy: [id number] at 5243w1d  1. History of pre-eclampsia in prior pregnancy, currently pregnant in first trimester Stable BP, on ASA.  2. Flu vaccine need - Flu Vaccine QUAD 36+ mos IM (Fluarix, Quad PF)  3. Supervision of high risk pregnancy, antepartum Normal sequential screening, repeat anatomy scan scheduled. Preterm labor symptoms and general obstetric precautions including but not limited to vaginal bleeding, contractions, leaking of fluid and fetal movement were reviewed in detail with the patient. Please refer to After Visit Summary for other counseling recommendations.  Return in about 4 weeks (around 08/10/2017) for OB Visit.   Jaynie CollinsUgonna Alanii Ramer, MD

## 2017-07-13 NOTE — Patient Instructions (Addendum)
Return to clinic for any scheduled appointments or obstetric concerns, or go to [facility] for evaluation Influenza (Flu) Vaccine (Inactivated or Recombinant): What You Need to Know  1. Why get vaccinated? Influenza ("flu") is a contagious disease that spreads around the [place] every year, usually between October and May. Flu is caused by influenza viruses, and is spread mainly by coughing, sneezing, and close contact. Anyone can get flu. Flu strikes suddenly and can last several days. Symptoms vary by age, but can include:  fever/chills  sore throat  muscle aches  fatigue  cough  headache  runny or stuffy nose  Flu can also lead to pneumonia and blood infections, and cause diarrhea and seizures in children. If you have a medical condition, such as heart or lung disease, flu can make it worse. Flu is more dangerous for some people. Infants and young children, people 59 years of age and older, pregnant women, and people with certain health conditions or a weakened immune system are at greatest risk. Each year thousands of people in the [place] die from flu, and many more are hospitalized. Flu vaccine can:  keep you from getting flu,  make flu less severe if you do get it, and  keep you from spreading flu to your family and other people. 2. Inactivated and recombinant flu vaccines A dose of flu vaccine is recommended every flu season. Children 6 months through 97 years of age may need two doses during the same flu season. Everyone else needs only one dose each flu season. Some inactivated flu vaccines contain a very small amount of a mercury-based preservative called thimerosal. Studies have not shown thimerosal in vaccines to be harmful, but flu vaccines that do not contain thimerosal are available. There is no live flu virus in flu shots. They cannot cause the flu. There are many flu viruses, and they are always changing. Each year a new flu vaccine is made to protect against  three or four viruses that are likely to cause disease in the upcoming flu season. But even when the vaccine doesn't exactly match these viruses, it may still provide some protection. Flu vaccine cannot prevent:  flu that is caused by a virus not covered by the vaccine, or  illnesses that look like flu but are not.  It takes about 2 weeks for protection to develop after vaccination, and protection lasts through the flu season. 3. Some people should not get this vaccine Tell the person who is giving you the vaccine:  If you have any severe, life-threatening allergies. If you ever had a life-threatening allergic reaction after a dose of flu vaccine, or have a severe allergy to any part of this vaccine, you may be advised not to get vaccinated. Most, but not all, types of flu vaccine contain a small amount of egg protein.  If you ever had Guillain-Barr Syndrome (also called GBS). Some people with a history of GBS should not get this vaccine. This should be discussed with your doctor.  If you are not feeling well. It is usually okay to get flu vaccine when you have a mild illness, but you might be asked to come back when you feel better.  4. Risks of a vaccine reaction With any medicine, including vaccines, there is a chance of reactions. These are usually mild and go away on their own, but serious reactions are also possible. Most people who get a flu shot do not have any problems with it. Minor problems following a  flu shot include:  soreness, redness, or swelling where the shot was given  hoarseness  sore, red or itchy eyes  cough  fever  aches  headache  itching  fatigue  If these problems occur, they usually begin soon after the shot and last 1 or 2 days. More serious problems following a flu shot can include the following:  There may be a small increased risk of Guillain-Barre Syndrome (GBS) after inactivated flu vaccine. This risk has been estimated at 1 or 2 additional  cases per million people vaccinated. This is much lower than the risk of severe complications from flu, which can be prevented by flu vaccine.  Young children who get the flu shot along with pneumococcal vaccine (PCV13) and/or DTaP vaccine at the same time might be slightly more likely to have a seizure caused by fever. Ask your doctor for more information. Tell your doctor if a child who is getting flu vaccine has ever had a seizure.  Problems that could happen after any injected vaccine:  People sometimes faint after a medical procedure, including vaccination. Sitting or lying down for about 15 minutes can help prevent fainting, and injuries caused by a fall. Tell your doctor if you feel dizzy, or have vision changes or ringing in the ears.  Some people get severe pain in the shoulder and have difficulty moving the arm where a shot was given. This happens very rarely.  Any medication can cause a severe allergic reaction. Such reactions from a vaccine are very rare, estimated at about 1 in a million doses, and would happen within a few minutes to a few hours after the vaccination. As with any medicine, there is a very remote chance of a vaccine causing a serious injury or death. The safety of vaccines is always being monitored. For more information, visit: http://floyd.org/www.cdc.gov/vaccinesafety/ 5. What if there is a serious reaction? What should I look for? Look for anything that concerns you, such as signs of a severe allergic reaction, very high fever, or unusual behavior. Signs of a severe allergic reaction can include hives, swelling of the face and throat, difficulty breathing, a fast heartbeat, dizziness, and weakness. These would start a few minutes to a few hours after the vaccination. What should I do?  If you think it is a severe allergic reaction or other emergency that can't wait, call 9-1-1 and get the person to the nearest hospital. Otherwise, call your doctor.  Reactions should be reported  to the Vaccine Adverse Event Reporting System (VAERS). Your doctor should file this report, or you can do it yourself through the VAERS web site at www.vaers.LAgents.nohhs.gov, or by calling 1-607-579-2391. ? VAERS does not give medical advice. 6. The National Vaccine Injury Compensation Program The [employer] Vaccine Injury Compensation Program (VICP) is a federal program that was created to compensate people who may have been injured by certain vaccines. Persons who believe they may have been injured by a vaccine can learn about the program and about filing a claim by calling 1-(651)269-6488 or visiting the VICP website at SpiritualWord.atwww.hrsa.gov/vaccinecompensation. There is a time limit to file a claim for compensation. 7. How can I learn more?  Ask your healthcare provider. He or she can give you the vaccine package insert or suggest other sources of information.  Call your local or state health department.  Contact the Centers for Disease Control and Prevention (CDC): ? Call 27939593351-445-847-6261 (1-800-CDC-INFO) or ? Visit CDC's website at BiotechRoom.com.cywww.cdc.gov/flu Vaccine Information Statement, Inactivated Influenza Vaccine (04/26/2014) This  information is not intended to replace advice given to you by your health care provider. Make sure you discuss any questions you have with your health care provider. Document Released: 07/01/2006 Document Revised: 05/27/2016 Document Reviewed: 05/27/2016 Elsevier Interactive Patient Education  2017 [employer].

## 2017-08-02 ENCOUNTER — Encounter (HOSPITAL_COMMUNITY): Payer: Self-pay

## 2017-08-02 ENCOUNTER — Ambulatory Visit (HOSPITAL_COMMUNITY)
Admission: RE | Admit: 2017-08-02 | Discharge: 2017-08-02 | Disposition: A | Discharge status: Home / Self Care | Payer: Medicaid Other | Source: Ambulatory Visit | Attending: Certified Nurse Midwife | Admitting: Certified Nurse Midwife

## 2017-08-02 ENCOUNTER — Other Ambulatory Visit (HOSPITAL_COMMUNITY): Payer: Self-pay | Admitting: Obstetrics and Gynecology

## 2017-08-02 DIAGNOSIS — Z3A23 23 weeks gestation of pregnancy: Secondary | ICD-10-CM | POA: Insufficient documentation

## 2017-08-02 DIAGNOSIS — IMO0002 Reserved for concepts with insufficient information to code with codable children: Secondary | ICD-10-CM

## 2017-08-02 DIAGNOSIS — Z0489 Encounter for examination and observation for other specified reasons: Secondary | ICD-10-CM | POA: Diagnosis present

## 2017-08-02 DIAGNOSIS — O09299 Supervision of pregnancy with other poor reproductive or obstetric history, unspecified trimester: Secondary | ICD-10-CM | POA: Insufficient documentation

## 2017-08-02 DIAGNOSIS — O361920 Maternal care for other isoimmunization, second trimester, not applicable or unspecified: Secondary | ICD-10-CM | POA: Diagnosis not present

## 2017-08-02 DIAGNOSIS — O99212 Obesity complicating pregnancy, second trimester: Secondary | ICD-10-CM

## 2017-08-02 DIAGNOSIS — O09892 Supervision of other high risk pregnancies, second trimester: Secondary | ICD-10-CM

## 2017-08-10 ENCOUNTER — Ambulatory Visit (INDEPENDENT_AMBULATORY_CARE_PROVIDER_SITE_OTHER): Payer: Medicaid Other | Admitting: Obstetrics & Gynecology

## 2017-08-10 ENCOUNTER — Other Ambulatory Visit: Payer: Self-pay

## 2017-08-10 VITALS — BP 126/83 | HR 75 | Wt 214.0 lb

## 2017-08-10 DIAGNOSIS — O0990 Supervision of high risk pregnancy, unspecified, unspecified trimester: Secondary | ICD-10-CM

## 2017-08-10 DIAGNOSIS — F5089 Other specified eating disorder: Secondary | ICD-10-CM

## 2017-08-10 DIAGNOSIS — F5083 Pica in adults: Secondary | ICD-10-CM | POA: Insufficient documentation

## 2017-08-10 NOTE — Progress Notes (Signed)
Pt notes that she is craving salt she states she is eating sea salt.

## 2017-08-10 NOTE — Patient Instructions (Signed)
Second Trimester of Pregnancy The second trimester is from week 13 through week 28, month 4 through 6. This is often the time in pregnancy that you feel your best. Often times, morning sickness has lessened or quit. You may have more energy, and you may get hungry more often. Your unborn baby (fetus) is growing rapidly. At the end of the sixth month, he or she is about 9 inches long and weighs about 1 pounds. You will likely feel the baby move (quickening) between 18 and 20 weeks of pregnancy. Follow these instructions at home:  Avoid all smoking, herbs, and alcohol. Avoid drugs not approved by your doctor.  Do not use any tobacco products, including cigarettes, chewing tobacco, and electronic cigarettes. If you need help quitting, ask your doctor. You may get counseling or other support to help you quit.  Only take medicine as told by your doctor. Some medicines are safe and some are not during pregnancy.  Exercise only as told by your doctor. Stop exercising if you start having cramps.  Eat regular, healthy meals.  Wear a good support bra if your breasts are tender.  Do not use hot tubs, steam rooms, or saunas.  Wear your seat belt when driving.  Avoid raw meat, uncooked cheese, and liter boxes and soil used by cats.  Take your prenatal vitamins.  Take 1500-2000 milligrams of calcium daily starting at the 20th week of pregnancy until you deliver your baby.  Try taking medicine that helps you poop (stool softener) as needed, and if your doctor approves. Eat more fiber by eating fresh fruit, vegetables, and whole grains. Drink enough fluids to keep your pee (urine) clear or pale yellow.  Take warm water baths (sitz baths) to soothe pain or discomfort caused by hemorrhoids. Use hemorrhoid cream if your doctor approves.  If you have puffy, bulging veins (varicose veins), wear support hose. Raise (elevate) your feet for 15 minutes, 3-4 times a day. Limit salt in your diet.  Avoid heavy  lifting, wear low heals, and sit up straight.  Rest with your legs raised if you have leg cramps or low back pain.  Visit your dentist if you have not gone during your pregnancy. Use a soft toothbrush to brush your teeth. Be gentle when you floss.  You can have sex (intercourse) unless your doctor tells you not to.  Go to your doctor visits. Get help if:  You feel dizzy.  You have mild cramps or pressure in your lower belly (abdomen).  You have a nagging pain in your belly area.  You continue to feel sick to your stomach (nauseous), throw up (vomit), or have watery poop (diarrhea).  You have bad smelling fluid coming from your vagina.  You have pain with peeing (urination). Get help right away if:  You have a fever.  You are leaking fluid from your vagina.  You have spotting or bleeding from your vagina.  You have severe belly cramping or pain.  You lose or gain weight rapidly.  You have trouble catching your breath and have chest pain.  You notice sudden or extreme puffiness (swelling) of your face, hands, ankles, feet, or legs.  You have not felt the baby move in over an hour.  You have severe headaches that do not go away with medicine.  You have vision changes. This information is not intended to replace advice given to you by your health care provider. Make sure you discuss any questions you have with your health care   provider. Document Released: 12/01/2009 Document Revised: 02/12/2016 Document Reviewed: 11/07/2012 Elsevier Interactive Patient Education  2017 Elsevier Inc.  

## 2017-08-10 NOTE — Progress Notes (Signed)
   PRENATAL VISIT NOTE  Subjective:  Jamie Riggs is a 31 y.o. [phone] at 7917w1d being seen today for ongoing prenatal care.  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  Patient reports craves and consumes salt.  Contractions: Not present. Vag. Bleeding: None.  Movement: Present. Denies leaking of fluid.   The following portions of the patient's history were reviewed and updated as appropriate: allergies, current medications, past family history, past medical history, past social history, past surgical history and problem list. Problem list updated.  Objective:   Vitals:   08/10/17 0830  BP: 126/83  Pulse: 75  Weight: 97.1 kg (214 lb)    Fetal Status:   Fundal Height: 25 cm Movement: Present     General:  Alert, oriented and cooperative. Patient is in no acute distress.  Skin: Skin is warm and dry. No rash noted.   Cardiovascular: Normal heart rate noted  Respiratory: Normal respiratory effort, no problems with respiration noted  Abdomen: Soft, gravid, appropriate for gestational age.  Pain/Pressure: Absent     Pelvic: Cervical exam deferred        Extremities: Normal range of motion.  Edema: Trace  Mental Status:  Normal mood and affect. Normal behavior. Normal judgment and thought content.   Assessment and Plan:  Pregnancy: [id number] at 2117w1d  1. Supervision of high risk pregnancy, antepartum BP is normal, advised to limit salt intake  Preterm labor symptoms and general obstetric precautions including but not limited to vaginal bleeding, contractions, leaking of fluid and fetal movement were  reviewed in detail with the patient. Please refer to After Visit Summary for other counseling recommendations.  Return in about 4 weeks (around 09/07/2017) for 2 hr GTT.   Scheryl DarterJames Forrest Jaroszewski, MD

## 2017-08-25 ENCOUNTER — Ambulatory Visit (INDEPENDENT_AMBULATORY_CARE_PROVIDER_SITE_OTHER): Payer: Medicaid Other | Admitting: Certified Nurse Midwife

## 2017-08-25 VITALS — BP 126/79 | HR 93 | Wt 215.0 lb

## 2017-08-25 DIAGNOSIS — O0990 Supervision of high risk pregnancy, unspecified, unspecified trimester: Secondary | ICD-10-CM

## 2017-08-25 DIAGNOSIS — O0992 Supervision of high risk pregnancy, unspecified, second trimester: Secondary | ICD-10-CM

## 2017-08-25 NOTE — Progress Notes (Signed)
   PRENATAL VISIT NOTE  Subjective:  Jamie Riggs is a 31 y.o. [phone] at 3938w2d being seen today for ongoing prenatal care.  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  Patient reports fatigue, no bleeding, no contractions, no cramping, no leaking and heart racing/pounding in her chest, did not have this experience with her other pregnancies.  Contractions: Not present. Vag. Bleeding: None.  Movement: Present. Denies leaking of fluid.   The following portions of the patient's history were reviewed and updated as appropriate: allergies, current medications, past family history, past medical history, past social history, past surgical history and problem list. Problem list updated.  Objective:   Vitals:   08/25/17 1105  BP: 126/79  Pulse: 93  Weight: 215 lb (97.5 kg)    Fetal Status: Fetal Heart Rate (bpm): 145; doppler Fundal Height: 30 cm Movement: Present     General:  Alert, oriented and cooperative. Patient is in no acute distress.  Skin: Skin is warm and dry. No rash noted.   Cardiovascular: Normal heart rate noted  Respiratory: Normal respiratory effort, no problems with respiration noted  Abdomen: Soft, gravid, appropriate for gestational age.  Pain/Pressure: Absent     Pelvic: Cervical exam deferred        Extremities: Normal range of motion.     Mental Status:  Normal mood and affect. Normal behavior. Normal judgment and thought content.   Assessment and Plan:  Pregnancy: [id number] at 2038w2d  1. Supervision of high risk pregnancy, antepartum     R/O thyroid issues or  anemia.  Reassurance given.  - CBC - TSH Pregnancy  Preterm labor symptoms and general obstetric precautions including but not limited to vaginal bleeding, contractions, leaking of fluid and fetal movement were reviewed in detail with the patient. Please refer to After Visit Summary for other counseling recommendations.  [facility] about 2 weeks (around 09[facility]08/2017[facility].   Roe Coombsachelle A Denney, CNM

## 2017-08-26 ENCOUNTER — Other Ambulatory Visit: Payer: Self-pay | Admitting: Certified Nurse Midwife

## 2017-08-26 DIAGNOSIS — O0990 Supervision of high risk pregnancy, unspecified, unspecified trimester: Secondary | ICD-10-CM

## 2017-08-26 DIAGNOSIS — D509 Iron deficiency anemia, unspecified: Secondary | ICD-10-CM | POA: Insufficient documentation

## 2017-08-26 DIAGNOSIS — O99013 Anemia complicating pregnancy, third trimester: Secondary | ICD-10-CM

## 2017-08-26 DIAGNOSIS — O99012 Anemia complicating pregnancy, second trimester: Secondary | ICD-10-CM

## 2017-08-26 LAB — CBC
Hematocrit: 27.5 % — ABNORMAL LOW (ref 34.0–46.6)
Hemoglobin: 8.7 g/dL — ABNORMAL LOW (ref 11.1–15.9)
MCH: 23.2 pg — ABNORMAL LOW (ref 26.6–33.0)
MCHC: 31.6 g/dL (ref 31.5–35.7)
MCV: 73 fL — AB (ref 79–97)
PLATELETS: 326 10*3/uL (ref 150–379)
RBC: 3.75 x10E6/uL — AB (ref 3.77–5.28)
RDW: 17 % — ABNORMAL HIGH (ref 12.3–15.4)
WBC: 10.3 10*3/uL (ref 3.4–10.8)

## 2017-08-26 LAB — TSH PREGNANCY: TSH Pregnancy: 1.48 u[IU]/mL (ref 0.450–4.500)

## 2017-08-26 MED ORDER — CITRANATAL BLOOM 90-1 MG PO TABS: 1.0000 | ORAL_TABLET | Freq: Every day | ORAL | 12 refills | Status: DC

## 2017-09-02 LAB — IRON AND TIBC
IRON SATURATION: 7 % — AB (ref 15–55)
IRON: 31 ug/dL (ref 27–159)
Total Iron Binding Capacity: 472 ug/dL — ABNORMAL HIGH (ref 250–450)
UIBC: 441 ug/dL — ABNORMAL HIGH (ref 131–425)

## 2017-09-02 LAB — SPECIMEN STATUS REPORT

## 2017-09-02 LAB — FERRITIN: Ferritin: 6 ng/mL — ABNORMAL LOW (ref 15–150)

## 2017-09-08 ENCOUNTER — Ambulatory Visit (INDEPENDENT_AMBULATORY_CARE_PROVIDER_SITE_OTHER): Payer: Medicaid Other | Admitting: Certified Nurse Midwife

## 2017-09-08 ENCOUNTER — Other Ambulatory Visit: Payer: Self-pay | Admitting: Certified Nurse Midwife

## 2017-09-08 ENCOUNTER — Other Ambulatory Visit: Payer: Medicaid Other

## 2017-09-08 VITALS — BP 125/76 | HR 89 | Wt 215.2 lb

## 2017-09-08 DIAGNOSIS — Z8632 Personal history of gestational diabetes: Secondary | ICD-10-CM

## 2017-09-08 DIAGNOSIS — D509 Iron deficiency anemia, unspecified: Secondary | ICD-10-CM

## 2017-09-08 DIAGNOSIS — O09291 Supervision of pregnancy with other poor reproductive or obstetric history, first trimester: Secondary | ICD-10-CM

## 2017-09-08 DIAGNOSIS — O0992 Supervision of high risk pregnancy, unspecified, second trimester: Secondary | ICD-10-CM

## 2017-09-08 DIAGNOSIS — O0990 Supervision of high risk pregnancy, unspecified, unspecified trimester: Secondary | ICD-10-CM

## 2017-09-08 DIAGNOSIS — O09292 Supervision of pregnancy with other poor reproductive or obstetric history, second trimester: Secondary | ICD-10-CM

## 2017-09-08 DIAGNOSIS — O99013 Anemia complicating pregnancy, third trimester: Principal | ICD-10-CM

## 2017-09-08 NOTE — Progress Notes (Signed)
   PRENATAL VISIT NOTE  Subjective:  Jamie Riggs is a 31 y.o. [phone] at 33110w2d being seen today for ongoing prenatal care.  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  Patient reports no complaints.  Contractions: Irritability. Vag. Bleeding: None.  Movement: Present. Denies leaking of fluid.   The following portions of the patient's history were reviewed and updated as appropriate: allergies, current medications, past family history, past medical history, past social history, past surgical history and problem list. Problem list updated.  Objective:   Vitals:   09/08/17 0846  BP: 125/76  Pulse: 89  Weight: 215 lb 3.2 oz (97.6 kg)    Fetal Status: Fetal Heart Rate (bpm): 138; doppler Fundal Height: 34 cm Movement: Present     General:  Alert, oriented and cooperative. Patient is in no acute distress.  Skin: Skin is warm and dry. No rash noted.   Cardiovascular: Normal heart rate noted  Respiratory: Normal respiratory effort, no problems with respiration noted  Abdomen: Soft, gravid, appropriate for gestational age.  Pain/Pressure: Absent     Pelvic: Cervical exam deferred        Extremities: Normal range of motion.  Edema: None  Mental Status:  Normal mood and affect. Normal behavior. Normal judgment and thought content.   Assessment and Plan:  Pregnancy: A5W0981G6P3023 at 78110w2d  1. Supervision of high risk pregnancy, antepartum     Doing well. BTL paperwork completed.  2. History of gestational  diabetes      2 hour OGTT today  3. History of pre-eclampsia in prior pregnancy, currently pregnant in first trimester     Taking baby ASA  Preterm labor symptoms and general obstetric precautions including but not limited to vaginal bleeding, contractions, leaking of fluid and fetal movement were reviewed in detail with the patient. Please refer to After Visit Summary for other counseling recommendations.  Return in about 2 weeks (around 09/22/2017) for ROB.   Roe Coombsachelle A Tyshan Enderle, CNM

## 2017-09-08 NOTE — Progress Notes (Signed)
Patient reports good fetal movement with some uterine irritability at times.

## 2017-09-10 LAB — CBC
HEMATOCRIT: 27.1 % — AB (ref 34.0–46.6)
Hemoglobin: 8.2 g/dL — ABNORMAL LOW (ref 11.1–15.9)
MCH: 22.3 pg — ABNORMAL LOW (ref 26.6–33.0)
MCHC: 30.3 g/dL — ABNORMAL LOW (ref 31.5–35.7)
MCV: 74 fL — AB (ref 79–97)
PLATELETS: 290 10*3/uL (ref 150–379)
RBC: 3.67 x10E6/uL — AB (ref 3.77–5.28)
RDW: 17.3 % — ABNORMAL HIGH (ref 12.3–15.4)
WBC: 9.2 10*3/uL (ref 3.4–10.8)

## 2017-09-10 LAB — GLUCOSE TOLERANCE, 2 HOURS W/ 1HR
Glucose, 1 hour: 108 mg/dL (ref 65–179)
Glucose, 2 hour: 145 mg/dL (ref 65–152)
Glucose, Fasting: 76 mg/dL (ref 65–91)

## 2017-09-10 LAB — HIV ANTIBODY (ROUTINE TESTING W REFLEX): HIV Screen 4th Generation wRfx: NONREACTIVE

## 2017-09-10 LAB — RPR: RPR Ser Ql: NONREACTIVE

## 2017-09-12 ENCOUNTER — Telehealth: Payer: Self-pay | Admitting: Hematology

## 2017-09-12 NOTE — Telephone Encounter (Signed)
Spoke with patient regarding appointment D/T/Loc/Phone# °

## 2017-09-15 ENCOUNTER — Encounter (HOSPITAL_COMMUNITY): Payer: Self-pay | Admitting: *Deleted

## 2017-09-15 ENCOUNTER — Inpatient Hospital Stay (HOSPITAL_COMMUNITY)
Admission: AD | Admit: 2017-09-15 | Discharge: 2017-09-15 | Disposition: A | Discharge status: Home / Self Care | Payer: Medicaid Other | Source: Ambulatory Visit | Attending: Obstetrics & Gynecology | Admitting: Obstetrics & Gynecology

## 2017-09-15 DIAGNOSIS — O0990 Supervision of high risk pregnancy, unspecified, unspecified trimester: Secondary | ICD-10-CM

## 2017-09-15 DIAGNOSIS — O26893 Other specified pregnancy related conditions, third trimester: Secondary | ICD-10-CM | POA: Diagnosis not present

## 2017-09-15 DIAGNOSIS — H538 Other visual disturbances: Secondary | ICD-10-CM

## 2017-09-15 DIAGNOSIS — Z8759 Personal history of other complications of pregnancy, childbirth and the puerperium: Secondary | ICD-10-CM | POA: Diagnosis not present

## 2017-09-15 DIAGNOSIS — Z87891 Personal history of nicotine dependence: Secondary | ICD-10-CM | POA: Diagnosis not present

## 2017-09-15 DIAGNOSIS — O24419 Gestational diabetes mellitus in pregnancy, unspecified control: Secondary | ICD-10-CM | POA: Insufficient documentation

## 2017-09-15 DIAGNOSIS — O9989 Other specified diseases and conditions complicating pregnancy, childbirth and the puerperium: Secondary | ICD-10-CM | POA: Diagnosis not present

## 2017-09-15 DIAGNOSIS — R51 Headache: Secondary | ICD-10-CM

## 2017-09-15 DIAGNOSIS — Z9889 Other specified postprocedural states: Secondary | ICD-10-CM | POA: Insufficient documentation

## 2017-09-15 DIAGNOSIS — Z833 Family history of diabetes mellitus: Secondary | ICD-10-CM | POA: Insufficient documentation

## 2017-09-15 DIAGNOSIS — Z8249 Family history of ischemic heart disease and other diseases of the circulatory system: Secondary | ICD-10-CM | POA: Diagnosis not present

## 2017-09-15 DIAGNOSIS — Z7982 Long term (current) use of aspirin: Secondary | ICD-10-CM | POA: Insufficient documentation

## 2017-09-15 DIAGNOSIS — Z809 Family history of malignant neoplasm, unspecified: Secondary | ICD-10-CM | POA: Diagnosis not present

## 2017-09-15 DIAGNOSIS — Z3A29 29 weeks gestation of pregnancy: Secondary | ICD-10-CM | POA: Diagnosis not present

## 2017-09-15 LAB — URINALYSIS, ROUTINE W REFLEX MICROSCOPIC
BILIRUBIN URINE: NEGATIVE
GLUCOSE, UA: NEGATIVE mg/dL
HGB URINE DIPSTICK: NEGATIVE
KETONES UR: NEGATIVE mg/dL
NITRITE: NEGATIVE
PH: 6 (ref 5.0–8.0)
Protein, ur: NEGATIVE mg/dL
Specific Gravity, Urine: 1.021 (ref 1.005–1.030)

## 2017-09-15 LAB — GLUCOSE, CAPILLARY: Glucose-Capillary: 105 mg/dL — ABNORMAL HIGH (ref 65–99)

## 2017-09-15 MED ORDER — BUTALBITAL-APAP-CAFFEINE 50-325-40 MG PO TABS
2.0000 | ORAL_TABLET | Freq: Once | ORAL | Status: AC
  Administered 2017-09-15: 2 via ORAL
  Filled 2017-09-15: qty 2

## 2017-09-15 NOTE — MAU Provider Note (Cosign Needed)
History     CSN: [id number]  Arrival date and time: 09/15/17 [id number]   None     Chief Complaint  Patient presents with  . Headache  . Blurred Vision   31 yo [phone] at 3261w2d here for blurred vision x 1 day with some dark spots in vision. She has a slight headache which she rates 1/10 but has not taken any medication for this. Patient has history of preeclampsia if previous pregnancy and is taking baby aspirin daily for this. No elevated blood pressure this pregnancy. Denies contractions or loss of fluid or vaginal bleeding or RUQ pain.    Past Medical History:  Diagnosis Date  . Ectopic pregnancy 2017  . Gestational diabetes   . Pregnancy induced hypertension     Past Surgical History:  Procedure Laterality Date  . WISDOM TOOTH EXTRACTION  2018    Family History  Problem Relation Age of Onset  . Diabetes Mother   . Hypertension Father   . Diabetes Maternal Grandmother   . Cancer Paternal Grandmother     Social History   Tobacco Use  . Smoking status: Former Games developermoker  . Smokeless tobacco: Never Used  Substance Use Topics  . Alcohol use: No  . Drug use: No    Allergies: No Known Allergies  Medications Prior to Admission  Medication Sig Dispense Refill Last Dose  . aspirin 81 MG chewable tablet Chew 1 tablet (81 mg total) by mouth daily. 30 tablet 12 Past Week at Unknown time  . Prenatal MV & Min w/FA-DHA (PRENATAL ADULT GUMMY/DHA/FA) 0.4-25 MG CHEW Chew 2 tablets by mouth daily.   09/14/2017 at Unknown time  . Prenat-FeAsp-Meth-FA-DHA w/o A (PRENATE PIXIE) 10-0.6-0.4-200 MG CAPS Take 1 tablet by mouth daily. 30 capsule 12 Taking    Review of Systems  Constitutional: Negative for chills and fatigue.  HENT: Negative for congestion and dental problem.   Eyes: Positive for visual disturbance. Negative for discharge and itching.  Respiratory: Negative for apnea and chest tightness.   Cardiovascular: Negative for chest pain and leg swelling.  Gastrointestinal:  Negative for abdominal distention and abdominal pain.  Endocrine: Negative for cold intolerance and heat intolerance.  Genitourinary: Negative for difficulty urinating and dysuria.  Musculoskeletal: Negative for arthralgias and back pain.  Neurological: Positive for headaches. Negative for dizziness.  Hematological: Negative for adenopathy. Does not bruise/bleed easily.   Physical Exam   Blood pressure 131/68, pulse 91, temperature (!) 97.4 F (36.3 C), temperature source Oral, resp. rate 16, height 5\' 1"  (1.549 m), weight 217 lb (98.4 kg), last menstrual period 02/22/2017, SpO2 100 %.  Physical Exam  Constitutional: She is oriented to person, place, and time. She appears well-developed and well-nourished. No distress.  HENT:  Head: Normocephalic and atraumatic.  Eyes: Conjunctivae are normal. Pupils are equal, round, and reactive to light.  Neck: Normal range of motion. Neck supple.  Cardiovascular: Normal rate and intact distal pulses.  Respiratory: Effort normal. No respiratory distress.  GI: Soft. Bowel sounds are normal. There is no tenderness.  Musculoskeletal: Normal range of motion. She exhibits no edema.  Neurological: She is alert and oriented to person, place, and time.  Skin: Skin is warm and dry.  Psychiatric: She has a normal mood and affect. Her behavior is normal.    MAU Course  Procedures  MDM Blood pressure wnl and repeat blood pressure wnl. Reviewed all previous prenatal visits and blood pressure has been normal. Low suspicion for preeclampsia given normal blood pressure. Consider atypical  migraine with vision changes. Trial fioricet. Will also get cbg since patient is newly diagnosed with GDM but has not check blood glucose at home.   CBG 105  Assessment and Plan  1. Pregnancy at 29 weeks completed gestation- follow up outpatient on 09/22/16 2. Blurred vision in pregnancy- unknown etiology but low suspicion for preeclampsia. Tylenol at home.  Sempra Energymber  Amandeep Hogston 09/15/2017, 11:00 AM

## 2017-09-15 NOTE — MAU Note (Signed)
Pt presents with c/o blurry vision and spots bilaterally, but worse in L eye.  Reports has mild headache.  Hasn't taken any meds for headache.  Reports +FM.  Denies VB or LOF.  Hx Pre Eclampsia with 1st pregnancy, IOL @ 38wks.

## 2017-09-15 NOTE — MAU Note (Signed)
Pt reports blurred vision (states a dark area in her vision on left side.), headache and some dizziness since yesterda

## 2017-09-15 NOTE — Discharge Instructions (Signed)

## 2017-09-16 ENCOUNTER — Ambulatory Visit (INDEPENDENT_AMBULATORY_CARE_PROVIDER_SITE_OTHER): Payer: Medicaid Other | Admitting: Obstetrics

## 2017-09-16 ENCOUNTER — Encounter: Payer: Self-pay | Admitting: Obstetrics

## 2017-09-16 VITALS — BP 133/74 | HR 81 | Wt 218.6 lb

## 2017-09-16 DIAGNOSIS — O09293 Supervision of pregnancy with other poor reproductive or obstetric history, third trimester: Secondary | ICD-10-CM

## 2017-09-16 DIAGNOSIS — O0990 Supervision of high risk pregnancy, unspecified, unspecified trimester: Secondary | ICD-10-CM

## 2017-09-16 DIAGNOSIS — O0993 Supervision of high risk pregnancy, unspecified, third trimester: Secondary | ICD-10-CM

## 2017-09-16 DIAGNOSIS — Z8632 Personal history of gestational diabetes: Secondary | ICD-10-CM

## 2017-09-16 DIAGNOSIS — O99013 Anemia complicating pregnancy, third trimester: Secondary | ICD-10-CM

## 2017-09-16 DIAGNOSIS — O09291 Supervision of pregnancy with other poor reproductive or obstetric history, first trimester: Secondary | ICD-10-CM

## 2017-09-16 MED ORDER — FUSION PLUS PO CAPS: 1.0000 | ORAL_CAPSULE | Freq: Every day | ORAL | 5 refills | Status: DC

## 2017-09-16 MED ORDER — ASPIRIN 81 MG PO CHEW: 81.0000 mg | CHEWABLE_TABLET | Freq: Every day | ORAL | 5 refills | Status: DC

## 2017-09-16 MED ORDER — VITAFOL FE+ 90-1-200 & 50 MG PO CPPK: 2.0000 | ORAL_CAPSULE | Freq: Every day | ORAL | 11 refills | Status: DC

## 2017-09-16 NOTE — Progress Notes (Signed)
Patient reports good fetal movement, denies pain. 

## 2017-09-16 NOTE — Progress Notes (Signed)
Subjective:  Jamie ForsterJessica Riggs is a 31 y.o. [phone] at 5369w3d being seen today for ongoing prenatal care.  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  Patient reports visual changes - seeing spots for past 3 days.  Denies HA, N/V or swelling of hands or feet.  Contractions: Not present. Vag. Bleeding: None.  Movement: Present. Denies leaking of fluid.   The following portions of the patient's history were reviewed and updated as appropriate: allergies, current medications, past family history, past medical history, past social history, past surgical history and problem list. Problem list updated.  Objective:   Vitals:   09/16/17 1326  BP: 133/74  Pulse: 81  Weight: 218 lb 9.6 oz (99.2 kg)    Fetal Status: Fetal Heart Rate (bpm): 150   Movement: Present     General:  Alert, oriented and cooperative. Patient is in no acute distress.  Skin: Skin is warm and dry. No rash noted.   Cardiovascular: Normal heart rate noted  Respiratory: Normal respiratory effort, no problems with respiration noted  Abdomen: Soft, gravid, appropriate for gestational age. Pain/Pressure: Absent     Pelvic:  Cervical exam deferred        Extremities: Normal range of motion.  Edema: None  Mental Status: Normal mood and affect. Normal behavior. Normal judgment and thought content.   Urinalysis:      Assessment and Plan:  Pregnancy: [id number] at 3969w3d  1. Supervision of high risk pregnancy, antepartum  2. History of gestational  diabetes  3. History of pre-eclampsia in prior pregnancy, currently pregnant in first trimester Rx: - aspirin 81 MG chewable tablet; Chew 1 tablet (81 mg total) by mouth daily.  Dispense: 30 tablet; Refill: 5  4. Anemia affecting pregnancy in third trimester Rx: - Prenat-FePoly-Metf-FA-DHA-DSS (VITAFOL FE+) 90-1-200 & 50 MG CPPK; Take 2 tablets by mouth daily before breakfast.  Dispense: 60 each; Refill: 11 - Iron-FA-B Cmp-C-Biot-Probiotic (FUSION PLUS) CAPS; Take 1 capsule by mouth daily before breakfast.  Dispense: 30 capsule; Refill: 5   Preterm labor symptoms and general obstetric precautions including but not limited to vaginal bleeding, contractions, leaking of fluid and fetal movement were reviewed in detail with the patient. Please refer to After Visit Summary for other counseling recommendations.  Return in about 2 weeks (around 09/30/2017) for ROB.   Brock BadHarper, Charles A, MD

## 2017-09-20 NOTE — L&D Delivery Note (Addendum)
Patient is 32 y.o. [id number] 5962w4d admitted for SROM at 1500. S/p augmentation with Pitocin. GBS +. Prenatal course also complicated by antibody positive screen, anemia.  Delivery Note At 12:36 AM a viable female was delivered via Vaginal, Spontaneous (Presentation: LOT).  APGAR: 5, ; weight pending.   Placenta status: Intact, ?partial abruption.  Cord: 3V  with the following complications: Tight nuchal x1.  Cord pH: 7.3  Anesthesia:  Epidural Episiotomy: None Lacerations: None Est. Blood Loss (mL): 100  Mom to postpartum.  Baby to Couplet care / Skin to Skin.  Caryl AdaJazma Lonald Troiani, DO 11/12/2017, 1:04 AM

## 2017-09-21 ENCOUNTER — Telehealth: Payer: Self-pay | Admitting: Hematology

## 2017-09-21 NOTE — Telephone Encounter (Signed)
Called patient and stated that I would forward her message to Triage nurse to handle.  Spoke with Roz and she stated that she did not take these calls and to forward message to Clinic Nurse for Dr Candise CheKale to advise patient on what to do regarding her decreased vision issues.

## 2017-09-22 ENCOUNTER — Telehealth: Payer: Self-pay

## 2017-09-22 ENCOUNTER — Encounter: Payer: Medicaid Other | Admitting: Nurse Practitioner

## 2017-09-22 NOTE — Telephone Encounter (Signed)
Returned call and pt stated that vision in her left eye is not getting any better and she is really worried because it is really blurry and referral appt is not until the 20th. Pt also states that she cannot find an eye doctor that accepts medicaid. Routed to provider for review.

## 2017-09-23 NOTE — Telephone Encounter (Signed)
[facility]

## 2017-09-26 ENCOUNTER — Other Ambulatory Visit: Payer: Self-pay | Admitting: Certified Nurse Midwife

## 2017-09-26 ENCOUNTER — Encounter (HOSPITAL_COMMUNITY): Payer: Self-pay

## 2017-09-26 ENCOUNTER — Telehealth: Payer: Self-pay

## 2017-09-26 ENCOUNTER — Emergency Department (HOSPITAL_COMMUNITY)
Admission: EM | Admit: 2017-09-26 | Discharge: 2017-09-27 | Disposition: A | Discharge status: Home / Self Care | Payer: Medicaid Other | Attending: Emergency Medicine | Admitting: Emergency Medicine

## 2017-09-26 ENCOUNTER — Other Ambulatory Visit: Payer: Self-pay

## 2017-09-26 DIAGNOSIS — H538 Other visual disturbances: Secondary | ICD-10-CM | POA: Diagnosis not present

## 2017-09-26 DIAGNOSIS — O0990 Supervision of high risk pregnancy, unspecified, unspecified trimester: Secondary | ICD-10-CM

## 2017-09-26 DIAGNOSIS — Z87891 Personal history of nicotine dependence: Secondary | ICD-10-CM | POA: Insufficient documentation

## 2017-09-26 DIAGNOSIS — Z3A30 30 weeks gestation of pregnancy: Secondary | ICD-10-CM | POA: Diagnosis not present

## 2017-09-26 DIAGNOSIS — D508 Other iron deficiency anemias: Secondary | ICD-10-CM | POA: Insufficient documentation

## 2017-09-26 DIAGNOSIS — Z3493 Encounter for supervision of normal pregnancy, unspecified, third trimester: Secondary | ICD-10-CM

## 2017-09-26 DIAGNOSIS — O26893 Other specified pregnancy related conditions, third trimester: Secondary | ICD-10-CM | POA: Insufficient documentation

## 2017-09-26 DIAGNOSIS — H539 Unspecified visual disturbance: Secondary | ICD-10-CM

## 2017-09-26 LAB — COMPREHENSIVE METABOLIC PANEL
ALT: 18 U/L (ref 14–54)
AST: 20 U/L (ref 15–41)
Albumin: 2.7 g/dL — ABNORMAL LOW (ref 3.5–5.0)
Alkaline Phosphatase: 129 U/L — ABNORMAL HIGH (ref 38–126)
Anion gap: 7 (ref 5–15)
BILIRUBIN TOTAL: 0.7 mg/dL (ref 0.3–1.2)
BUN: 9 mg/dL (ref 6–20)
CO2: 24 mmol/L (ref 22–32)
CREATININE: 0.5 mg/dL (ref 0.44–1.00)
Calcium: 8.4 mg/dL — ABNORMAL LOW (ref 8.9–10.3)
Chloride: 106 mmol/L (ref 101–111)
GFR calc Af Amer: >60 mL/min (ref >60)
Glucose, Bld: 143 mg/dL — ABNORMAL HIGH (ref 65–99)
POTASSIUM: 3.6 mmol/L (ref 3.5–5.1)
Sodium: 137 mmol/L (ref 135–145)
TOTAL PROTEIN: 6.3 g/dL — AB (ref 6.5–8.1)

## 2017-09-26 LAB — CBC WITH DIFFERENTIAL/PLATELET
BASOS ABS: 0 10*3/uL (ref 0.0–0.1)
Basophils Relative: 0 %
Eosinophils Absolute: 0.1 10*3/uL (ref 0.0–0.7)
Eosinophils Relative: 1 %
HEMATOCRIT: 26.3 % — AB (ref 36.0–46.0)
Hemoglobin: 7.9 g/dL — ABNORMAL LOW (ref 12.0–15.0)
LYMPHS ABS: 2 10*3/uL (ref 0.7–4.0)
LYMPHS PCT: 21 %
MCH: 23 pg — ABNORMAL LOW (ref 26.0–34.0)
MCHC: 30 g/dL (ref 30.0–36.0)
MCV: 76.5 fL — AB (ref 78.0–100.0)
MONO ABS: 0.6 10*3/uL (ref 0.1–1.0)
MONOS PCT: 6 %
NEUTROS ABS: 6.9 10*3/uL (ref 1.7–7.7)
Neutrophils Relative %: 72 %
Platelets: 290 10*3/uL (ref 150–400)
RBC: 3.44 MIL/uL — ABNORMAL LOW (ref 3.87–5.11)
RDW: 18.4 % — AB (ref 11.5–15.5)
WBC: 9.6 10*3/uL (ref 4.0–10.5)

## 2017-09-26 LAB — PROTEIN / CREATININE RATIO, URINE
Creatinine, Urine: 165.43 mg/dL
PROTEIN CREATININE RATIO: 0.17 mg/mg{creat} — AB (ref 0.00–0.15)
Total Protein, Urine: 28 mg/dL

## 2017-09-26 LAB — URINALYSIS, ROUTINE W REFLEX MICROSCOPIC
Bacteria, UA: NONE SEEN
Bilirubin Urine: NEGATIVE
Glucose, UA: NEGATIVE mg/dL
Hgb urine dipstick: NEGATIVE
Ketones, ur: NEGATIVE mg/dL
Nitrite: NEGATIVE
PH: 6 (ref 5.0–8.0)
Protein, ur: NEGATIVE mg/dL
SPECIFIC GRAVITY, URINE: 1.026 (ref 1.005–1.030)

## 2017-09-26 NOTE — Progress Notes (Signed)
Spoke with Dr. Earlene Plateravis notified her of pts Hx, current complaints, FHR tracing, and lab results. Will not clear her pregnancy wise until PR/Cr ratio comes back. Will call her for clearance at that time.

## 2017-09-26 NOTE — ED Provider Notes (Signed)
[facility]-EMERGENCY DEPT Provider Note   CSN: [id number] Arrival date & time: 09/26/17  1145     History   Chief Complaint Chief Complaint  Patient presents with  . Eye Problem  . [redacted] weeks pregnant    HPI Jamie Riggs is a 32 y.o. W2N5621 @ [redacted]w[redacted]d gestation presents to the ED with visual change. Patient is high risk pregnancy and hx of pre-eclampsia and gestational diabetes and anemia in prior pregnancy. Patient evaluated 2 weeks ago at [facility] and told she had a migraine. Patient then followed up with her OB, Dr. Clearance Coots and at that time he started her on Fe for anemia and prenatal vitamins with Fe. Patient called the office today re continued symptoms and was told to go to the ED. Patient reports that today she has blurry vision and she can't drive at night due to the vision change.  Patient reports that has appointment to see a hematologist 10/17/17 due to drop in Hgb.   HPI  Past Medical History:  Diagnosis Date  . Ectopic pregnancy 2017  . Gestational diabetes   . Pregnancy induced hypertension     Patient Active Problem List   Diagnosis Date Noted  . Maternal iron deficiency anemia affecting pregnancy in third trimester, antepartum 08/26/2017  . Pica in adults 08/10/2017  . H/O postpartum hemorrhage, currently pregnant 06/15/2017  . 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 Low hemoglobin A2 level in blood 05/17/2017  . Supervision of high risk pregnancy, antepartum 05/05/2017  . History of pre-eclampsia in prior pregnancy, currently pregnant in first trimester 05/05/2017  . History of gestational diabetes 05/05/2017    Past Surgical History:  Procedure Laterality Date  . WISDOM TOOTH EXTRACTION  2018    OB History    Gravida Para Term Preterm AB Living   6 3 3   2 3    SAB TAB Ectopic Multiple Live Births   1   1   3        Home Medications    Prior to Admission medications   Medication  Sig Start Date End Date Taking? Authorizing Provider  aspirin 81 MG chewable tablet Chew 1 tablet (81 mg total) by mouth daily. 09/16/17  Yes Brock Bad, MD  cyclobenzaprine (FLEXERIL) 10 MG tablet Take 5-10 mg by mouth as needed for muscle spasms.    Yes [provider]  docusate sodium (COLACE) 50 MG capsule Take 50 mg by mouth daily.   Yes [provider]  Iron-FA-B Cmp-C-Biot-Probiotic (FUSION PLUS) CAPS Take 1 capsule by mouth daily before breakfast. 09/16/17  Yes Brock Bad, MD  Prenat-FePoly-Metf-FA-DHA-DSS (VITAFOL FE+) 90-1-200 & 50 MG CPPK Take 2 tablets by mouth daily before breakfast. Patient taking differently: Take 1 tablet by mouth daily before breakfast.  09/16/17  Yes Brock Bad, MD    Family History Family History  Problem Relation Age of Onset  . Diabetes Mother   . Hypertension Father   . Diabetes Maternal Grandmother   . Cancer Paternal Grandmother     Social History Social History   Tobacco Use  . Smoking status: Former Games developer  . Smokeless tobacco: Never Used  Substance Use Topics  . Alcohol use: No  . Drug use: No     Allergies   Patient has no known allergies.   Review of Systems Review of Systems  Constitutional: Negative for chills and fever.  HENT: Negative.   Eyes: Positive for photophobia  and visual disturbance. Negative for pain, discharge, redness and itching.  Respiratory: Negative for cough and shortness of breath.   Cardiovascular: Negative for chest pain and leg swelling.  Gastrointestinal: Positive for constipation. Negative for abdominal pain, diarrhea, nausea and vomiting.  Genitourinary: Negative for difficulty urinating, dysuria, frequency, hematuria, pelvic pain, vaginal bleeding and vaginal discharge.  Musculoskeletal: Positive for back pain. Negative for joint swelling and myalgias.  Skin: Negative for rash.  Neurological: Positive for light-headedness. Negative for syncope, weakness and  headaches.  Psychiatric/Behavioral: Positive for sleep disturbance (past week ). Negative for confusion. The patient is not nervous/anxious.      Physical Exam Updated Vital Signs BP 133/84   Pulse 89   Temp 97.8 F (36.6 C) (Oral)   Resp 16   Ht 5\' 1"  (1.549 m)   Wt 98.9 kg (218 lb)   LMP 02/22/2017 (LMP Unknown)   SpO2 100%   BMI 41.19 kg/m   Physical Exam  Constitutional: She appears well-developed and well-nourished. No distress.  HENT:  Head: Normocephalic and atraumatic.  Eyes: Conjunctivae and EOM are normal. Pupils are equal, round, and reactive to light. Lids are everted and swept, no foreign bodies found. Right eye exhibits no discharge. Left eye exhibits no discharge.  Neck: Normal range of motion. Neck supple.  Cardiovascular: Normal rate and regular rhythm.  Pulmonary/Chest: Effort normal and breath sounds normal.  Abdominal: Soft. Bowel sounds are normal. There is no tenderness.  Positive FHT's.  Musculoskeletal: Normal range of motion. She exhibits no edema.  Neurological: She is alert.  Skin: Skin is warm and dry. She is not diaphoretic.  Psychiatric: She has a normal mood and affect. Her behavior is normal.  Nursing note and vitals reviewed.    ED Treatments / Results  Labs (all labs ordered are listed, but only abnormal results are displayed) Labs Reviewed  CBC WITH DIFFERENTIAL/PLATELET - Abnormal; Notable for the following components:      Result Value   RBC 3.44 (*)    Hemoglobin 7.9 (*)    HCT 26.3 (*)    MCV 76.5 (*)    MCH 23.0 (*)    RDW 18.4 (*)    All other components within normal limits  COMPREHENSIVE METABOLIC PANEL - Abnormal; Notable for the following components:   Glucose, Bld 143 (*)    Calcium 8.4 (*)    Total Protein 6.3 (*)    Albumin 2.7 (*)    Alkaline Phosphatase 129 (*)    All other components within normal limits  URINALYSIS, ROUTINE W REFLEX MICROSCOPIC - Abnormal; Notable for the following components:   APPearance  HAZY (*)    Leukocytes, UA LARGE (*)    Squamous Epithelial / LPF 6-30 (*)    All other components within normal limits  URINE CULTURE  PROTEIN / CREATININE RATIO, URINE   Radiology No results found.  Procedures Procedures (including critical care time) Visual acuity, labs, ophthalmology consult, OB consult.   Medications Ordered in ED Medications - No data to display  Consult with Dr. Fabian Sharp ophthalmology and he will see the patient in his office for follow up 10/03/17 @ 10:30 am.    Rapid response OB RN here to evaluate the patient. External fetal monitor tracing reactive. Patient cleared by [facility] attending. I discussed this case with Dr. Rodena Medin and he evaluated the patient as well. Patient stable for d/c.   Initial Impression / Assessment and Plan / ED Course  I have reviewed the triage vital signs  and the nursing notes.  32 y.o. [id number] @ 2745w6d here today with visual problems that started over 2 weeks ago and have persisted. PIH work up is negative. Patient denies headache today. The visual problems are worse while driving at night. Patient appears comfortable and is ambulatory in the ED without difficulty.  Patient has appointment with hematology for her anemia and is followed closely by Dr. Clearance CootsHarper for her pregnancy. Follow up with Dr. Dione BoozeGroat for visual changes. Appointment scheduled for 10/03/17 @ 10:30.   Clinical Course as of Sep 26 1702  Mon Sep 26, 2017  1403 MCH: (!) 23.0 [SM]  1404 MCH: (!) 23.0 [SM]    Clinical Course User Index [SM] Sharalyn InkMoverman, Sharone, Wisconsintudent-PA   Final Clinical Impressions(s) / ED Diagnoses   Final diagnoses:  Visual changes  Other iron deficiency anemia  Third trimester pregnancy at less than 36 weeks    ED Discharge Orders    None       Kerrie Buffaloeese, Hope LaBarque CreekM, NP 09/26/17 1708    Kerrie Buffaloeese, Hope Santa AnnaM, NP 09/26/17 1709    Wynetta FinesMessick, Peter C, MD 09/30/17 1418

## 2017-09-26 NOTE — Telephone Encounter (Signed)
Pt called to follow up on visual changes. Per Dr. Thomes LollingHarpers note pt advised to go to ER to be evaluated

## 2017-09-26 NOTE — Progress Notes (Signed)
Spoke with Dr Earlene Plateravis - pt cleared to be d/c'd to home with precautions and to follow up at next scheduled apptmt 09/30/17 - pt to keep opthalmology apptmt for 10/03/17 - CWhitlock RNC

## 2017-09-26 NOTE — Discharge Instructions (Addendum)
Appointment with Dr. Fabian SharpScott Groat 10/03/2017 @ 10:30 am. For your eye exam. Keep your regularly scheduled appointment with Dr. Clearance CootsHarper and with the hematologist.

## 2017-09-26 NOTE — ED Notes (Signed)
Called rapid OB RN and spoke with charge in L&D.  [facility] and will be put into route for this patient next.

## 2017-09-26 NOTE — ED Notes (Signed)
Bed: WA23 Expected date:  Expected time:  Means of arrival:  Comments: EMS-PNA 

## 2017-09-26 NOTE — ED Triage Notes (Signed)
Patient states she has been having blurred vision of the left eye with a dark circle in the vision field x 2 weeks. Patient states she is unable to drive at night. Patient [facility] and was diagnosed with migraine headache. Patient saw her PCP today and [facility]

## 2017-09-30 ENCOUNTER — Encounter: Payer: Self-pay | Admitting: Advanced Practice Midwife

## 2017-09-30 ENCOUNTER — Ambulatory Visit (INDEPENDENT_AMBULATORY_CARE_PROVIDER_SITE_OTHER): Payer: Medicaid Other | Admitting: Advanced Practice Midwife

## 2017-09-30 VITALS — BP 129/77 | HR 89 | Wt 220.5 lb

## 2017-09-30 DIAGNOSIS — Z23 Encounter for immunization: Secondary | ICD-10-CM

## 2017-09-30 DIAGNOSIS — O0993 Supervision of high risk pregnancy, unspecified, third trimester: Secondary | ICD-10-CM

## 2017-09-30 DIAGNOSIS — D509 Iron deficiency anemia, unspecified: Secondary | ICD-10-CM

## 2017-09-30 DIAGNOSIS — O09291 Supervision of pregnancy with other poor reproductive or obstetric history, first trimester: Secondary | ICD-10-CM

## 2017-09-30 DIAGNOSIS — H538 Other visual disturbances: Secondary | ICD-10-CM

## 2017-09-30 DIAGNOSIS — O0990 Supervision of high risk pregnancy, unspecified, unspecified trimester: Secondary | ICD-10-CM

## 2017-09-30 DIAGNOSIS — O09293 Supervision of pregnancy with other poor reproductive or obstetric history, third trimester: Secondary | ICD-10-CM

## 2017-09-30 DIAGNOSIS — O99013 Anemia complicating pregnancy, third trimester: Secondary | ICD-10-CM

## 2017-09-30 LAB — URINE CULTURE

## 2017-09-30 NOTE — Progress Notes (Signed)
   PRENATAL VISIT NOTE  Subjective:  Jamie Riggs is a 32 y.o. [phone] at 4457w3d being seen today for ongoing prenatal care.  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  Was seen in [facility] 09/26/17 for blurry vision in left eye. Nml Pre-E work-up. F/U scheduled w/ Optho 1/14. Patient denies HA, eye pain or drainage, weakness, difficulties w/ speech or gait.  Contractions: Irritability. Vag. Bleeding: None.  Movement: Present. Denies leaking of fluid.   The following portions of the patient's history were reviewed and updated as appropriate: allergies, current medications, past family history, past medical history, past social history, past surgical history and problem list. Problem list updated.  Objective:   Vitals:   09/30/17 0958  BP: 129/77  Pulse: 89  Weight: 220 lb 8 oz (100 kg)    Fetal Status: Fetal Heart Rate (bpm): 146 Fundal Height: 35 cm Movement: Present  Presentation: Vertex  General:  Alert, oriented and cooperative. Patient is in no acute distress.  Skin: Skin is warm and dry. No rash noted.   Cardiovascular: Normal heart rate noted  Respiratory: Normal respiratory effort, no problems with respiration noted  Abdomen: Soft, gravid, appropriate for gestational age.  Pain/Pressure: Present     Pelvic: Cervical exam deferred        Extremities: Normal range of motion.  Edema: None  Mental Status:  Normal mood and affect. Normal behavior. Normal judgment and thought content.   Assessment and  Plan:  Pregnancy: [id number] at 5257w3d  1. Supervision of high risk pregnancy, antepartum   2. Need for diphtheria-tetanus-pertussis (Tdap) vaccine  - Tdap vaccine greater than or equal to 7yo IM  3. History of pre-eclampsia in prior pregnancy, currently pregnant in first trimester - Vision changes w/out other evidence of pre-E. F/U w/ Optho.   4. Maternal iron deficiency anemia affecting pregnancy in third trimester, antepartum - Has F/U scheduled w/ Heme  5. Blurry vision, left. Unknown etiology. No evidence of Pre-E stroke or infection.   - F/U w/ Optho AS - Pre-E and stroke precautions.   Preterm labor symptoms and general obstetric precautions including but not limited to vaginal bleeding, contractions, leaking of fluid and fetal movement were reviewed in detail with the patient. Please refer to After Visit Summary for other counseling recommendations.  Return in 2 weeks (on 10/14/2017).   Dorathy KinsmanVirginia Dodd Schmid, CNM

## 2017-09-30 NOTE — Progress Notes (Signed)
Pt will receive Tdap vaccine today . Pt had recent [facility]U visit for change in vision on 09/26/17 Pt states her left eye has not got any better.

## 2017-09-30 NOTE — Patient Instructions (Addendum)
Postpartum Tubal Ligation Postpartum tubal ligation (PPTL) is a procedure to close the fallopian tubes. This is done so that you cannot get pregnant. When the fallopian tubes are closed, the eggs that the ovaries release cannot enter the uterus, and sperm cannot reach the eggs. PPTL is done right after childbirth or 1-2 days after childbirth, before the uterus returns to its normal location. PPTL is sometimes called "getting your tubes tied." You should not have this procedure if you want to get pregnant someday or if you are unsure about having more children. Tell a health care provider about:  Any allergies you have.  All medicines you are taking, including vitamins, herbs, eye drops, creams, and over-the-counter medicines.  Previous problems you or members of your family have had with the use of anesthetics.  Any blood disorders you have.  Previous surgeries you have had.  Any medical conditions you may have.  Any past pregnancies. What are the risks? Generally, this is a safe procedure. However, problems may occur, including:  Infection.  Bleeding.  Injury to surrounding organs.  Side effects from anesthetics.  Failure of the procedure.  This procedure can increase your risk of a kind of pregnancy in which a fertilized egg attaches to the outside of the uterus (ectopic pregnancy). What happens before the procedure?  Ask your health care provider about: ? How much pain you can expect to have. ? What medicines you will be given for pain, especially if you are planning to breastfeed.  Follow instructions from your health care provider about eating and drinking restrictions. What happens during the procedure? If you had a vaginal delivery:  You may be given one or more of the following: ? A medicine that helps you relax (sedative). ? A medicine to numb the area (local anesthetic). ? A medicine to make you fall asleep (general anesthetic). ? A medicine that is injected  into an area of your body to numb everything below the injection site (regional anesthetic).  If you have been given a general anesthetic, a tube will be put down your throat to help you breathe.  An IV tube will be inserted into one of your veins to give you medicines and fluids during the procedure.  Your bladder may be emptied with a small tube (catheter).  An incision will be made just below your belly button.  Your fallopian tubes will be located and brought up through the incision.  Your fallopian tubes will be tied off, burned (cauterized), or blocked with a clip, ring, or clamp. A small portion in the center of each fallopian tube may be removed.  The incision will be closed with stitches (sutures).  A bandage (dressing) will be placed over the incision.  If you had a cesarean delivery:  Tubal ligation will be done through the incision that was used for the cesarean delivery of your baby.  The incision will be closed with sutures.  A dressing will be placed over the incision.  The procedure may vary among health care providers and hospitals. What happens after the procedure?  Your blood pressure, heart rate, breathing rate, and blood oxygen level will be monitored often until the medicines you were given have worn off.  You will be given pain medicine as needed.  Do not drive for 24 hours if you received a sedative. This information is not intended to replace advice given to you by your health care provider. Make sure you discuss any questions you have with your health  care provider. Document Released: 09/06/2005 Document Revised: 02/09/2016 Document Reviewed: 08/17/2015 Elsevier Interactive Patient Education  2018 [employer].   [employer] of the uterus can occur throughout pregnancy, but they are not always a sign that you are in labor. You may have practice contractions called Braxton Hicks contractions. These false labor contractions  are sometimes confused with true labor. What are Deberah Pelton contractions? Braxton Hicks contractions are tightening movements that occur in the muscles of the uterus before labor. Unlike true labor contractions, these contractions do not result in opening (dilation) and thinning of the cervix. Toward the end of pregnancy (32-34 weeks), Braxton Hicks contractions can happen more often and may become stronger. These contractions are sometimes difficult to tell apart from true labor because they can be very uncomfortable. You should not feel embarrassed if you go to the hospital with false labor. Sometimes, the only way to tell if you are in true labor is for your health care provider to look for changes in the cervix. The health care provider will do a physical exam and may monitor your contractions. If you are not in true labor, the exam should show that your cervix is not dilating and your water has not broken. If there are other health problems associated with your pregnancy, it is completely safe for you to be sent home with false labor. You may continue to have Braxton Hicks contractions until you go into true labor. How to tell the difference between true labor and false labor True labor  Contractions last 30-70 seconds.  Contractions become very regular.  Discomfort is usually felt in the top of the uterus, and it spreads to the lower abdomen and low back.  Contractions do not go away with walking.  Contractions usually become more intense and increase in frequency.  The cervix dilates and gets thinner. False labor  Contractions are usually shorter and not as strong as true labor contractions.  Contractions are usually irregular.  Contractions are often felt in the front of the lower abdomen and in the groin.  Contractions may go away when you walk around or change positions while lying down.  Contractions get weaker and are shorter-lasting as time goes on.  The cervix usually  does not dilate or become thin. Follow these instructions at home:  Take over-the-counter and prescription medicines only as told by your health care provider.  Keep up with your usual exercises and follow other instructions from your health care provider.  Eat and drink lightly if you think you are going into labor.  If Braxton Hicks contractions are making you uncomfortable: ? Change your position from lying down or resting to walking, or change from walking to resting. ? Sit and rest in a tub of warm water. ? Drink enough fluid to keep your urine pale yellow. Dehydration may cause these contractions. ? Do slow and deep breathing several times an hour.  Keep all follow-up prenatal visits as told by your health care provider. This is important. Contact a health care provider if:  You have a fever.  You have continuous pain in your abdomen. Get help right away if:  Your contractions become stronger, more regular, and closer together.  You have fluid leaking or gushing from your vagina.  You pass blood-tinged mucus (bloody show).  You have bleeding from your vagina.  You have low back pain that you never had before.  You feel your baby's head pushing down and causing pelvic pressure.  Your  baby is not moving inside you as much as it used to. Summary  Contractions that occur before labor are called Braxton Hicks contractions, false labor, or practice contractions.  Braxton Hicks contractions are usually shorter, weaker, farther apart, and less regular than true labor contractions. True labor contractions usually become progressively stronger and regular and they become more frequent.  Manage discomfort from [facility] Hicks contractions by changing position, resting in a warm bath, drinking plenty of water, or practicing deep breathing. This information is not intended to replace advice given to you by your health care provider. Make sure you discuss any questions you have with  your health care provider. Document Released: 01/20/2017 Document Revised: 01/20/2017 Document Reviewed: 01/20/2017 Elsevier Interactive Patient Education  2018 [employer]

## 2017-10-01 ENCOUNTER — Encounter: Payer: Self-pay | Admitting: Obstetrics and Gynecology

## 2017-10-01 ENCOUNTER — Telehealth: Payer: Self-pay

## 2017-10-01 DIAGNOSIS — R8271 Bacteriuria: Secondary | ICD-10-CM | POA: Insufficient documentation

## 2017-10-01 LAB — OB RESULTS CONSOLE GBS: GBS: POSITIVE

## 2017-10-01 NOTE — Telephone Encounter (Signed)
Post ED Visit - Positive Culture Follow-up: Successful Patient Follow-Up  Culture assessed and recommendations reviewed by: []  Enzo BiNathan Batchelder, Pharm.D. []  Celedonio MiyamotoJeremy Frens, Pharm.D., BCPS AQ-ID [x]  Garvin FilaMike Maccia, Pharm.D., BCPS []  Georgina PillionElizabeth Martin, [address] BoulevardPharm.D., BCPS []  [place] CityMinh Pham, [place]arm.D., BCPS, AAHIVP []  Estella HuskMichelle Turner, Pharm.D., BCPS, AAHIVP []  Lysle Pearlachel Rumbarger, PharmD, BCPS []  Casilda Carlsaylor Stone, PharmD, BCPS []  Pollyann SamplesAndy Johnston, PharmD, BCPS  Positive urine culture  [x]  Patient discharged without antimicrobial prescription and treatment is now indicated []  Organism is resistant to prescribed ED discharge antimicrobial []  Patient with positive blood cultures  Changes discussed with ED provider: Catalina AntiguaPeggy Constant MD New antibiotic prescription Amoxicillin 500 mg PO BID x 7 days [facility] [address]   [phone]  Contacted patient, date 10/01/17, time 1148   Austyn Seier, Linnell FullingRose Burnett 10/01/2017, 11:47 AM

## 2017-10-01 NOTE — Progress Notes (Signed)
ED Antimicrobial Stewardship Positive Culture Follow Up   Wyn ForsterJessica Riggs is an 32 y.o. female who [facility] with a chief complaint of  Chief Complaint  Patient presents with  . Eye Problem  . [redacted] weeks pregnant    Recent Results (from the past 720 hour(s))  Urine culture     Status: Abnormal   Collection Time: 09/26/17 12:33 PM  Result Value Ref Range Status   Specimen Description URINE, RANDOM  Final   Special Requests NONE  Final   Culture (A)  Final    2,000 COLONIES/mL GROUP B STREP(S.AGALACTIAE)ISOLATED TESTING AGAINST S. AGALACTIAE NOT ROUTINELY PERFORMED DUE TO PREDICTABILITY OF AMP/PEN/VAN SUSCEPTIBILITY. CRITICAL RESULT CALLED TO, READ BACK BY AND VERIFIED WITH: V.MILLER RN AT 1105 ON [id number] BY SJW Performed at [facility], [address] [place], [place] [address]    Report Status 09/30/2017 FINAL  Final   pregnant  New antibiotic prescription: amox 500 bid x 1 week  Dr Jolayne Pantheronstant, Veatrice BourbonB  Estanislado Surgeon A Ahad Colarusso 10/01/2017, 10:26 AM Infectious Diseases Pharmacist Phone# [phone]

## 2017-10-11 NOTE — Progress Notes (Signed)
CONSULT NOTE  Patient Care Team: Roe Coombs, CNM as PCP - General (Certified Nurse Midwife)  CHIEF COMPLAINTS/PURPOSE OF CONSULTATION:  Iron deficiency in Pregnancy  HISTORY OF PRESENTING ILLNESS:   Jamie Riggs 32 y.o. female is here because of a referral from her OBGYN Dr. Doroteo Glassman regarding her anemia.  The pt reports that she is doing well overall and being excited about her pregnancy with a little boy. The pt is at [redacted] weeks pregnant, is a high risk pregnancy and has a history of anemia. [id number] .  Pt has a 31 y.o, a 31 y.o, and an 75 month old. Pt's current pregnancy occurred while having an IUD that ended up falling out. Pt reports infrequent but regular flow menses when not pregnant.  Pt has been taking prenatal vitamins with iron (Vitaful) beginning 2 months ago around 25 weeks of her pregnancy, and has been taking extra iron pills PO (Fusion Plus with 130 mg iron q day) since September 16, 2017. Pt reports feeling weak frequently though notes having 5 kids in the house is part of that.   Pt notes blurry vision and mild double vision since taking the iron pills, and per pt her eye doctor at [facility] noted swelling behind the left eye. Has an appointment with a retina doctor on Friday. She notes 2 visits to the ED regarding her vision problems.   Pt has not taken iron pills in the past, never had a blood transfusion, nor taken/need IV iron. Pt had a known S antibody during the pregnancy with her 15 month old. Pt reports no hemolysis on her last pregnancy. Pt reports occasional blood glucose levels dropping to 35 with previous pregnancies. Pt reports post-partum hemorrhage with her last pregnancy. Pt reports having a balloon done with her last pregnancy. All of her pregnancies resulted in vaginal births.   Of note prior to the patient's visit today, pt had CBC completed on 09/08/17 with results revealing RBC at 3.67, Hgb at 8.2, HTC at 27.1. Also as of 08/25/17 her ferritin  was at 6. Hgb electrophoresis showed normal adult  Lab results on 09/26/17 of CBC is as follows: all values are WNL except for RBC at 3.44, Hgb at 7.9, HCT at 26.3, MCV at 76.5, MCH at 23.0, RDW at 18.4.  On review of systems, pt reports salt cravings and ice pica, and denies lightheadedness and diziness, melena, nose bleeds, gum bleeds, irregular bleeding, leg swelling, and any other symptoms.   The pt has a PMHx of a pre-eclampsia pregnancy only on her first pregnancy (which she takes baby aspirin for), anemia, gestational diabetes with her second child.   MEDICAL HISTORY:  Past Medical History:  Diagnosis Date  . Ectopic pregnancy 2017  . Gestational diabetes   . Pregnancy induced hypertension     SURGICAL HISTORY: Past Surgical History:  Procedure Laterality Date  . WISDOM TOOTH EXTRACTION  2018    SOCIAL HISTORY: Social History   Socioeconomic History  . Marital status: Legally Separated    Spouse name: Not on file  . Number of children: Not on file  . Years of education: Not on file  . Highest education level: Not on file  Social Needs  . Financial resource strain: Not on file  . Food insecurity - worry: Not on file  . Food insecurity - inability: Not on file  . Transportation needs - medical: Not on file  . Transportation needs - non-medical: Not on file  Occupational History  .  Not on file  Tobacco Use  . Smoking status: Former Games developermoker  . Smokeless tobacco: Never Used  Substance and Sexual Activity  . Alcohol use: No  . Drug use: No  . Sexual activity: Yes    Partners: Male    Birth control/protection: None  Other Topics Concern  . Not on file  Social History Narrative  . Not on file    FAMILY HISTORY: Family History  Problem Relation Age of Onset  . Diabetes Mother   . Hypertension Father   . Diabetes Maternal Grandmother   . Cancer Paternal Grandmother     ALLERGIES:  has No Known Allergies.  MEDICATIONS:  Current Outpatient Medications    Medication Sig Dispense Refill  . aspirin 81 MG chewable tablet Chew 1 tablet (81 mg total) by mouth daily. 30 tablet 5  . cyclobenzaprine (FLEXERIL) 10 MG tablet Take 5-10 mg by mouth as needed for muscle spasms.     Marland Kitchen. docusate sodium (COLACE) 50 MG capsule Take 50 mg by mouth daily.    . Iron-FA-B Cmp-C-Biot-Probiotic (FUSION PLUS) CAPS Take 1 capsule by mouth daily before breakfast. 30 capsule 5  . Prenat-FePoly-Metf-FA-DHA-DSS (VITAFOL FE+) 90-1-200 & 50 MG CPPK Take 2 tablets by mouth daily before breakfast. (Patient taking differently: Take 1 tablet by mouth daily before breakfast. ) 60 each 11  . iron polysaccharides (NIFEREX) 150 MG capsule Take 1 capsule (150 mg total) by mouth 2 (two) times daily. 60 capsule 3   No current facility-administered medications for this visit.     REVIEW OF SYSTEMS:   Constitutional: Denies fevers, chills or abnormal night sweats Eyes: Denies blurriness of vision, double vision or watery eyes Ears, nose, mouth, throat, and face: Denies mucositis or sore throat Respiratory: Denies cough, dyspnea or wheezes Cardiovascular: Denies palpitation, chest discomfort or lower extremity swelling Gastrointestinal:  Denies nausea, heartburn or change in bowel habits Skin: Denies abnormal skin rashes Lymphatics: Denies new lymphadenopathy or easy bruising Neurological:Denies numbness, tingling or new weaknesses Behavioral/Psych: Mood is stable, no new changes  All other systems were reviewed with the patient and are negative.  PHYSICAL EXAMINATION:  Vitals:   10/12/17 1002  BP: 130/64  Pulse: 90  Resp: 18  Temp: 97.6 F (36.4 C)  SpO2: 100%   Filed Weights   10/12/17 1002  Weight: 219 lb 9.6 oz (99.6 kg)    GENERAL:alert, no distress and comfortable SKIN: skin color, texture, turgor are normal, no rashes or significant lesions EYES: normal, pallor conjunctiva and non-injected, sclera clear OROPHARYNX:no exudate, no erythema and lips, buccal  mucosa, and tongue normal  NECK: supple, thyroid normal size, non-tender, without nodularity LYMPH:  no palpable lymphadenopathy in the cervical, axillary or inguinal LUNGS: clear to auscultation and percussion with normal breathing effort HEART: regular rate & rhythm and no murmurs and no lower extremity edema ABDOMEN:abdomen soft, non-tender and normal bowel sounds, gravid uterus  Musculoskeletal:no cyanosis of digits and no clubbing  PSYCH: alert & oriented x 3 with fluent speech NEURO: no focal motor/sensory deficits  LABORATORY DATA:  I have reviewed the data as listed  . CBC Latest Ref Rng & Units 10/12/2017 09/26/2017 09/08/2017  WBC 3.9 - 10.3 K/uL 9.0 9.6 9.2  Hemoglobin 12.0 - 15.0 g/dL - 7.9(L) 8.2(L)  Hematocrit 34.8 - 46.6 % 28.0(L) 26.3(L) 27.1(L)  Platelets 145 - 400 K/uL 287 290 290  hgb 8.1  . Lab Results  Component Value Date   RETICCTPCT 2.1 10/12/2017   RBC 3.67 (L) 10/12/2017  RBC 3.67 (L) 10/12/2017    . CMP Latest Ref Rng & Units 10/12/2017 09/26/2017  Glucose 70 - 140 mg/dL 213 086(V)  BUN 7 - 26 mg/dL 9 9  Creatinine 7.84 - 1.00 mg/dL - 6.96  Sodium 295 - 284 mmol/L 138 137  Potassium 3.3 - 4.7 mmol/L 3.7 3.6  Chloride 98 - 109 mmol/L 107 106  CO2 22 - 29 mmol/L 24 24  Calcium 8.4 - 10.4 mg/dL 8.5 1.3(K)  Total Protein 6.4 - 8.3 g/dL 6.5 6.3(L)  Total Bilirubin 0.2 - 1.2 mg/dL 0.3 0.7  Alkaline Phos 40 - 150 U/L 163(H) 129(H)  AST 5 - 34 U/L 16 20  ALT 0 - 55 U/L 16 18   . Lab Results  Component Value Date   IRON 29 (L) 10/12/2017   TIBC 496 (H) 10/12/2017   IRONPCTSAT 6 (L) 10/12/2017   (Iron and TIBC)  Lab Results  Component Value Date   FERRITIN 6 (L) 10/12/2017      RADIOGRAPHIC STUDIES: I have personally reviewed the radiological images as listed and agreed with the findings in the report. No results found.  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 Risk  factors for severe iron deficiency - multiple pregnancies. Inadequate po iron replacement. Recent pregnancy 11 months ago with post partum hemorrhage.  hgb at 8.1 today  Plan -Discussed pt labwork today with the pt -Anemia appears to be driven primarily by iron deficiency + an element of hemodilution with 3rd trimester of pregnancy. No evidence of hemolysis. -Pt needs a higher dose of po iron that she is currently taking  -Discussed iron supplement options and risks and benefits of IV iron -Pt noted worries about blood transfusion and prefers a more rigorous treatment of IV iron instead of PO iron.  -we discussed and recommended PO iron replacement with Iron polysaccharide 150mg  po BID -in addition if Ob nursing support is available for continuous monitoring we will set her up for IV Venofer 200mg  weekly x 3 doses.  with need for nurse monitoring allergic reactions with fetal monitoring as well because pt will be in 3rd trimester -Informed pt of possible allergic reaction symptoms -Monitoring at [facility] as another option - this would need to be addressed by her Gyn physician if Ob nursing is not available at the cancer center. -Will combine PO and IV iron therapies  -Discussed what iron therapy could look like after delivery if needed -we discussed that aggressively treating the iron deficiency would be needed to reduce the risk of needing PRBC transfusion towards later pregnancy (esp to avoid PRBC transfusion in the setting of know allo-antibodies)  Plan: Labs today IV Venofer qweekly x 3 doses (will need co-ordination with Ob-Gyn RN and continuous materno-fetal monitoring during infusion) RTC with Dr Candise Che in 4 weeks with labs    All questions were answered. The patient knows to call the clinic with any problems, questions or concerns. I spent 45 minutes counseling the patient face to face. The total time spent in the appointment was 60 minutes and more than 50% was on  counseling.    This document serves as a record of services personally performed by Wyvonnia Lora, MD. It was created on his behalf by Marcelline Mates, a trained medical scribe. The creation of this record is based on the scribe's personal observations and the provider's statements to them.   .I have reviewed the above documentation for  accuracy and completeness, and I agree with the above. Johney Maine MD MS

## 2017-10-12 ENCOUNTER — Encounter: Payer: Self-pay | Admitting: Hematology

## 2017-10-12 ENCOUNTER — Telehealth: Payer: Self-pay | Admitting: Hematology

## 2017-10-12 ENCOUNTER — Inpatient Hospital Stay: Payer: Medicaid Other

## 2017-10-12 ENCOUNTER — Inpatient Hospital Stay: Payer: Medicaid Other | Attending: Hematology | Admitting: Hematology

## 2017-10-12 VITALS — BP 130/64 | HR 90 | Temp 97.6°F | Resp 18 | Ht 61.0 in | Wt 219.6 lb

## 2017-10-12 DIAGNOSIS — Z331 Pregnant state, incidental: Secondary | ICD-10-CM | POA: Diagnosis not present

## 2017-10-12 DIAGNOSIS — Z79899 Other long term (current) drug therapy: Secondary | ICD-10-CM | POA: Diagnosis not present

## 2017-10-12 DIAGNOSIS — R768 Other specified abnormal immunological findings in serum: Secondary | ICD-10-CM

## 2017-10-12 DIAGNOSIS — O99013 Anemia complicating pregnancy, third trimester: Secondary | ICD-10-CM

## 2017-10-12 DIAGNOSIS — Z7982 Long term (current) use of aspirin: Secondary | ICD-10-CM | POA: Diagnosis not present

## 2017-10-12 DIAGNOSIS — D509 Iron deficiency anemia, unspecified: Secondary | ICD-10-CM

## 2017-10-12 DIAGNOSIS — Z87891 Personal history of nicotine dependence: Secondary | ICD-10-CM | POA: Insufficient documentation

## 2017-10-12 DIAGNOSIS — H532 Diplopia: Secondary | ICD-10-CM | POA: Diagnosis not present

## 2017-10-12 DIAGNOSIS — Z809 Family history of malignant neoplasm, unspecified: Secondary | ICD-10-CM

## 2017-10-12 LAB — CBC WITH DIFFERENTIAL (CANCER CENTER ONLY)
BASOS ABS: 0 10*3/uL (ref 0.0–0.1)
BASOS PCT: 0 %
EOS ABS: 0.1 10*3/uL (ref 0.0–0.5)
EOS PCT: 1 %
HEMATOCRIT: 28 % — AB (ref 34.8–46.6)
Hemoglobin: 8.1 g/dL — ABNORMAL LOW (ref 11.6–15.9)
Lymphocytes Relative: 23 %
Lymphs Abs: 2.1 10*3/uL (ref 0.9–3.3)
MCH: 22.1 pg — ABNORMAL LOW (ref 25.1–34.0)
MCHC: 28.9 g/dL — ABNORMAL LOW (ref 31.5–36.0)
MCV: 76.3 fL — ABNORMAL LOW (ref 79.5–101.0)
MONO ABS: 0.5 10*3/uL (ref 0.1–0.9)
MONOS PCT: 5 %
NEUTROS ABS: 6.4 10*3/uL (ref 1.5–6.5)
Neutrophils Relative %: 71 %
PLATELETS: 287 10*3/uL (ref 145–400)
RBC: 3.67 MIL/uL — ABNORMAL LOW (ref 3.70–5.45)
RDW: 18.4 % — AB (ref 11.2–16.1)
WBC Count: 9 10*3/uL (ref 3.9–10.3)

## 2017-10-12 LAB — CMP (CANCER CENTER ONLY)
ALBUMIN: 2.5 g/dL — AB (ref 3.5–5.0)
ALK PHOS: 163 U/L — AB (ref 40–150)
ALT: 16 U/L (ref 0–55)
ANION GAP: 7 (ref 3–11)
AST: 16 U/L (ref 5–34)
BUN: 9 mg/dL (ref 7–26)
CALCIUM: 8.5 mg/dL (ref 8.4–10.4)
CO2: 24 mmol/L (ref 22–29)
CREATININE: 0.6 mg/dL (ref 0.60–1.10)
Chloride: 107 mmol/L (ref 98–109)
GFR, Est AFR Am: >60 mL/min (ref >60)
GFR, Estimated: >60 mL/min (ref >60)
GLUCOSE: 140 mg/dL (ref 70–140)
Potassium: 3.7 mmol/L (ref 3.3–4.7)
SODIUM: 138 mmol/L (ref 136–145)
TOTAL PROTEIN: 6.5 g/dL (ref 6.4–8.3)
Total Bilirubin: 0.3 mg/dL (ref 0.2–1.2)

## 2017-10-12 LAB — IRON AND TIBC
Iron: 29 ug/dL — ABNORMAL LOW (ref 41–142)
SATURATION RATIOS: 6 % — AB (ref 21–57)
TIBC: 496 ug/dL — ABNORMAL HIGH (ref 236–444)
UIBC: 467 ug/dL

## 2017-10-12 LAB — RETICULOCYTES
RBC.: 3.67 MIL/uL — AB (ref 3.70–5.45)
RETIC COUNT ABSOLUTE: 77.1 10*3/uL (ref 33.7–90.7)
RETIC CT PCT: 2.1 % (ref 0.7–2.1)

## 2017-10-12 LAB — LACTATE DEHYDROGENASE: LDH: 157 U/L (ref 125–245)

## 2017-10-12 LAB — FERRITIN: Ferritin: 6 ng/mL — ABNORMAL LOW (ref 9–269)

## 2017-10-12 MED ORDER — POLYSACCHARIDE IRON COMPLEX 150 MG PO CAPS: 150.0000 mg | ORAL_CAPSULE | Freq: Two times a day (BID) | ORAL | 3 refills | Status: DC

## 2017-10-12 NOTE — Telephone Encounter (Signed)
Scheduled appt per 1/23 los - Gave patient AVS and calender per los.  

## 2017-10-12 NOTE — Patient Instructions (Signed)
Thank you for choosing [facility] to provide your oncology and hematology care.  To afford each patient quality time with our providers, please arrive 30 minutes before your scheduled appointment time.  If you arrive late for your appointment, you may be asked to reschedule.  We strive to give you quality time with our providers, and arriving late affects you and other patients whose appointments are after yours.   If you are a no show for multiple scheduled visits, you may be dismissed from the clinic at the providers discretion.    Again, thank you for choosing [facility], our hope is that these requests will decrease the amount of time that you wait before being seen by our physicians.  ______________________________________________________________________  Should you have questions after your visit to the  Cancer Center, please contact our office at [phone] between the hours of 8:30 and 4:30 p.m.    Voicemails left after 4:30p.m will not be returned until the following business day.    For prescription refill requests, please have your pharmacy contact us directly.  Please also try to allow 48 hours for prescription requests.    Please contact the scheduling department for questions regarding scheduling.  For scheduling of procedures such as PET scans, CT scans, MRI, Ultrasound, etc please contact central scheduling at [phone].    Resources For Cancer Patients and Caregivers:   Oncolink.org:  A wonderful resource for patients and healthcare providers for information regarding your disease, ways to tract your treatment, what to expect, etc.     American Cancer Society:  800-227-2345  Can help patients locate various types of support and financial assistance  Cancer Care: 1-800-813-HOPE (4673) Provides financial assistance, online support groups, medication/co-pay assistance.    [place] DSS:  336-641-3447 Where to apply for food  stamps, Medicaid, and utility assistance  Medicare Rights Center: 800-333-4114 Helps people with Medicare understand their rights and benefits, navigate the Medicare system, and secure the quality healthcare they deserve  SCAT: 336-333-6589 [place] [employer]'s shared-ride transportation service for eligible riders who have a disability that prevents them from riding the fixed route bus.    For additional information on assistance programs please contact our social worker:   Grier Hock/Abigail Elmore:  [phone]            

## 2017-10-13 LAB — HAPTOGLOBIN: Haptoglobin: 121 mg/dL (ref 34–200)

## 2017-10-14 ENCOUNTER — Encounter: Payer: Medicaid Other | Admitting: Obstetrics and Gynecology

## 2017-10-17 ENCOUNTER — Encounter: Payer: Self-pay | Admitting: Obstetrics and Gynecology

## 2017-10-17 ENCOUNTER — Ambulatory Visit (INDEPENDENT_AMBULATORY_CARE_PROVIDER_SITE_OTHER): Payer: Medicaid Other | Admitting: Obstetrics and Gynecology

## 2017-10-17 VITALS — BP 125/81 | HR 94 | Wt 222.9 lb

## 2017-10-17 DIAGNOSIS — O0990 Supervision of high risk pregnancy, unspecified, unspecified trimester: Secondary | ICD-10-CM

## 2017-10-17 DIAGNOSIS — O09291 Supervision of pregnancy with other poor reproductive or obstetric history, first trimester: Secondary | ICD-10-CM

## 2017-10-17 DIAGNOSIS — Z3009 Encounter for other general counseling and advice on contraception: Secondary | ICD-10-CM | POA: Insufficient documentation

## 2017-10-17 DIAGNOSIS — Z8632 Personal history of gestational diabetes: Secondary | ICD-10-CM

## 2017-10-17 DIAGNOSIS — O99013 Anemia complicating pregnancy, third trimester: Secondary | ICD-10-CM

## 2017-10-17 DIAGNOSIS — D509 Iron deficiency anemia, unspecified: Secondary | ICD-10-CM

## 2017-10-17 DIAGNOSIS — R8271 Bacteriuria: Secondary | ICD-10-CM

## 2017-10-17 NOTE — Patient Instructions (Signed)
Third Trimester of Pregnancy The third trimester is from week 28 through week 40 (months 7 through 9). The third trimester is a time when the unborn baby (fetus) is growing rapidly. At the end of the ninth month, the fetus is about 20 inches in length and weighs 6-10 pounds. Body changes during your third trimester Your body will continue to go through many changes during pregnancy. The changes vary from woman to woman. During the third trimester:  Your weight will continue to increase. You can expect to gain 25-35 pounds (11-16 kg) by the end of the pregnancy.  You may begin to get stretch marks on your hips, abdomen, and breasts.  You may urinate more often because the fetus is moving lower into your pelvis and pressing on your bladder.  You may develop or continue to have heartburn. This is caused by increased hormones that slow down muscles in the digestive tract.  You may develop or continue to have constipation because increased hormones slow digestion and cause the muscles that push waste through your intestines to relax.  You may develop hemorrhoids. These are swollen veins (varicose veins) in the rectum that can itch or be painful.  You may develop swollen, bulging veins (varicose veins) in your legs.  You may have increased body aches in the pelvis, back, or thighs. This is due to weight gain and increased hormones that are relaxing your joints.  You may have changes in your hair. These can include thickening of your hair, rapid growth, and changes in texture. Some women also have hair loss during or after pregnancy, or hair that feels dry or thin. Your hair will most likely return to normal after your baby is born.  Your breasts will continue to grow and they will continue to become tender. A yellow fluid (colostrum) may leak from your breasts. This is the first milk you are producing for your baby.  Your belly button may stick out.  You may notice more swelling in your hands,  face, or ankles.  You may have increased tingling or numbness in your hands, arms, and legs. The skin on your belly may also feel numb.  You may feel short of breath because of your expanding uterus.  You may have more problems sleeping. This can be caused by the size of your belly, increased need to urinate, and an increase in your body's metabolism.  You may notice the fetus "dropping," or moving lower in your abdomen (lightening).  You may have increased vaginal discharge.  You may notice your joints feel loose and you may have pain around your pelvic bone.  What to expect at prenatal visits You will have prenatal exams every 2 weeks until week 36. Then you will have weekly prenatal exams. During a routine prenatal visit:  You will be weighed to make sure you and the baby are growing normally.  Your blood pressure will be taken.  Your abdomen will be measured to track your baby's growth.  The fetal heartbeat will be listened to.  Any test results from the previous visit will be discussed.  You may have a cervical check near your due date to see if your cervix has softened or thinned (effaced).  You will be tested for Group B streptococcus. This happens between 35 and 37 weeks.  Your health care provider may ask you:  What your birth plan is.  How you are feeling.  If you are feeling the baby move.  If you have had   any abnormal symptoms, such as leaking fluid, bleeding, severe headaches, or abdominal cramping.  If you are using any tobacco products, including cigarettes, chewing tobacco, and electronic cigarettes.  If you have any questions.  Other tests or screenings that may be performed during your third trimester include:  Blood tests that check for low iron levels (anemia).  Fetal testing to check the health, activity level, and growth of the fetus. Testing is done if you have certain medical conditions or if there are problems during the  pregnancy.  Nonstress test (NST). This test checks the health of your baby to make sure there are no signs of problems, such as the baby not getting enough oxygen. During this test, a belt is placed around your belly. The baby is made to move, and its heart rate is monitored during movement.  What is false labor? False labor is a condition in which you feel small, irregular tightenings of the muscles in the womb (contractions) that usually go away with rest, changing position, or drinking water. These are called Braxton Hicks contractions. Contractions may last for hours, days, or even weeks before true labor sets in. If contractions come at regular intervals, become more frequent, increase in intensity, or become painful, you should see your health care provider. What are the signs of labor?  Abdominal cramps.  Regular contractions that start at 10 minutes apart and become stronger and more frequent with time.  Contractions that start on the top of the uterus and spread down to the lower abdomen and back.  Increased pelvic pressure and dull back pain.  A watery or bloody mucus discharge that comes from the vagina.  Leaking of amniotic fluid. This is also known as your "water breaking." It could be a slow trickle or a gush. Let your health care provider know if it has a color or strange odor. If you have any of these signs, call your health care provider right away, even if it is before your due date. Follow these instructions at home: Medicines  Follow your health care provider's instructions regarding medicine use. Specific medicines may be either safe or unsafe to take during pregnancy.  Take a prenatal vitamin that contains at least 600 micrograms (mcg) of folic acid.  If you develop constipation, try taking a stool softener if your health care provider approves. Eating and drinking  Eat a balanced diet that includes fresh fruits and vegetables, whole grains, good sources of protein  such as meat, eggs, or tofu, and low-fat dairy. Your health care provider will help you determine the amount of weight gain that is right for you.  Avoid raw meat and uncooked cheese. These carry germs that can cause birth defects in the baby.  If you have low calcium intake from food, talk to your health care provider about whether you should take a daily calcium supplement.  Eat four or five small meals rather than three large meals a day.  Limit foods that are high in fat and processed sugars, such as fried and sweet foods.  To prevent constipation: ? Drink enough fluid to keep your urine clear or pale yellow. ? Eat foods that are high in fiber, such as fresh fruits and vegetables, whole grains, and beans. Activity  Exercise only as directed by your health care provider. Most women can continue their usual exercise routine during pregnancy. Try to exercise for 30 minutes at least 5 days a week. Stop exercising if you experience uterine contractions.  Avoid heavy   lifting.  Do not exercise in extreme heat or humidity, or at high altitudes.  Wear low-heel, comfortable shoes.  Practice good posture.  You may continue to have sex unless your health care provider tells you otherwise. Relieving pain and discomfort  Take frequent breaks and rest with your legs elevated if you have leg cramps or low back pain.  Take warm sitz baths to soothe any pain or discomfort caused by hemorrhoids. Use hemorrhoid cream if your health care provider approves.  Wear a good support bra to prevent discomfort from breast tenderness.  If you develop varicose veins: ? Wear support pantyhose or compression stockings as told by your healthcare provider. ? Elevate your feet for 15 minutes, 3-4 times a day. Prenatal care  Write down your questions. Take them to your prenatal visits.  Keep all your prenatal visits as told by your health care provider. This is important. Safety  Wear your seat belt at  all times when driving.  Make a list of emergency phone numbers, including numbers for family, friends, the hospital, and police and fire departments. General instructions  Avoid cat litter boxes and soil used by cats. These carry germs that can cause birth defects in the baby. If you have a cat, ask someone to clean the litter box for you.  Do not travel far distances unless it is absolutely necessary and only with the approval of your health care provider.  Do not use hot tubs, steam rooms, or saunas.  Do not drink alcohol.  Do not use any products that contain nicotine or tobacco, such as cigarettes and e-cigarettes. If you need help quitting, ask your health care provider.  Do not use any medicinal herbs or unprescribed drugs. These chemicals affect the formation and growth of the baby.  Do not douche or use tampons or scented sanitary pads.  Do not cross your legs for long periods of time.  To prepare for the arrival of your baby: ? Take prenatal classes to understand, practice, and ask questions about labor and delivery. ? Make a trial run to the hospital. ? Visit the hospital and tour the maternity area. ? Arrange for maternity or paternity leave through employers. ? Arrange for family and friends to take care of pets while you are in the hospital. ? Purchase a rear-facing car seat and make sure you know how to install it in your car. ? Pack your hospital bag. ? Prepare the baby's nursery. Make sure to remove all pillows and stuffed animals from the baby's crib to prevent suffocation.  Visit your dentist if you have not gone during your pregnancy. Use a soft toothbrush to brush your teeth and be gentle when you floss. Contact a health care provider if:  You are unsure if you are in labor or if your water has broken.  You become dizzy.  You have mild pelvic cramps, pelvic pressure, or nagging pain in your abdominal area.  You have lower back pain.  You have persistent  nausea, vomiting, or diarrhea.  You have an unusual or bad smelling vaginal discharge.  You have pain when you urinate. Get help right away if:  Your water breaks before 37 weeks.  You have regular contractions less than 5 minutes apart before 37 weeks.  You have a fever.  You are leaking fluid from your vagina.  You have spotting or bleeding from your vagina.  You have severe abdominal pain or cramping.  You have rapid weight loss or weight gain.    You have shortness of breath with chest pain.  You notice sudden or extreme swelling of your face, hands, ankles, feet, or legs.  Your baby makes fewer than 10 movements in 2 hours.  You have severe headaches that do not go away when you take medicine.  You have vision changes. Summary  The third trimester is from week 28 through week 40, months 7 through 9. The third trimester is a time when the unborn baby (fetus) is growing rapidly.  During the third trimester, your discomfort may increase as you and your baby continue to gain weight. You may have abdominal, leg, and back pain, sleeping problems, and an increased need to urinate.  During the third trimester your breasts will keep growing and they will continue to become tender. A yellow fluid (colostrum) may leak from your breasts. This is the first milk you are producing for your baby.  False labor is a condition in which you feel small, irregular tightenings of the muscles in the womb (contractions) that eventually go away. These are called Braxton Hicks contractions. Contractions may last for hours, days, or even weeks before true labor sets in.  Signs of labor can include: abdominal cramps; regular contractions that start at 10 minutes apart and become stronger and more frequent with time; watery or bloody mucus discharge that comes from the vagina; increased pelvic pressure and dull back pain; and leaking of amniotic fluid. This information is not intended to replace advice  given to you by your health care provider. Make sure you discuss any questions you have with your health care provider. Document Released: 08/31/2001 Document Revised: 02/12/2016 Document Reviewed: 11/07/2012 Elsevier Interactive Patient Education  2017 Elsevier Inc.  

## 2017-10-17 NOTE — Progress Notes (Signed)
Subjective:  Jamie ForsterJessica Riggs is a 32 y.o. [id number] at 2366w6d being seen today for ongoing prenatal care.  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  Patient reports reports a gush of fluid over the weekend, none since..  Contractions: Not present. Vag. Bleeding: None.  Movement: Present. Denies leaking of fluid.   The following portions of the patient's history were reviewed and updated as appropriate: allergies, current medications, past family history, past medical history, past social history, past surgical history and problem list. Problem list updated.  Objective:   Vitals:   10/17/17 1607  BP: 125/81  Pulse: 94  Weight: 222 lb 14.4 oz (101.1 kg)    Fetal Status: Fetal Heart Rate (bpm): 135   Movement: Present     General:  Alert, oriented and cooperative. Patient is in no acute distress.  Skin: Skin is warm and dry. No rash noted.   Cardiovascular: Normal heart rate noted  Respiratory: Normal respiratory effort, no problems with respiration noted  Abdomen: Soft, gravid, appropriate for gestational age. Pain/Pressure: Present     Pelvic:  Cervical exam performed        Extremities: Normal range of motion.  Edema: None  Mental Status: Normal mood and affect. Normal behavior. Normal judgment and thought content.   Urinalysis:      Assessment and Plan:  Pregnancy: [id number] at 9166w6d  1. Supervision of high risk pregnancy,  antepartum Stable SSE neg pooling, ferning and Nitrazine. White discharge noted Pt reassured  2. Maternal iron deficiency anemia affecting pregnancy in third trimester, antepartum Stable and followed by Heme Will check growth scan @ 36 weeks - US MFM OB FOLLOW UP; Future  3. History of pre-eclampsia in prior pregnancy, currently pregnant in first trimester Stable Continue with BASA  4. GBS bacteriuria Tx while in labor  5. History of gestational diabetes Normal Glucola  6. Unwanted fertility BTL papers signed  Preterm labor symptoms and general obstetric precautions including but not limited to vaginal bleeding, contractions, leaking of fluid and fetal movement were reviewed in detail with the patient. Please refer to After Visit Summary for other counseling recommendations.  Return in about 2 weeks (around 10/31/2017) for OB visit.   Hermina StaggersErvin, Lazarius Rivkin L, MD

## 2017-10-18 ENCOUNTER — Telehealth: Payer: Self-pay | Admitting: Pediatrics

## 2017-10-18 ENCOUNTER — Telehealth: Payer: Self-pay | Admitting: *Deleted

## 2017-10-18 ENCOUNTER — Telehealth: Payer: Self-pay

## 2017-10-18 NOTE — Telephone Encounter (Signed)
Per Samantha: Dr Candise CheKale will not allow 3rd trimester iron infusions to be done at the cancer center.  She states Dr [facility]ale [facility] for continuous monitoring during infusion.  [facility] states [facility] not [facility] for that purpose.  Lelon MastSamantha reports Dr Alysia PennaErvin advised we could do infusion at office.  Please advise where this patient needs to go for next infusion.

## 2017-10-18 NOTE — Telephone Encounter (Signed)
Samantha left message on nurse line advising pt's Fe infusion could not be done d/t GA.  She states pt reports she will rec at our office.  I called Samantha back, LMTCB.  We do not do Fe infusions at our office.

## 2017-10-18 NOTE — Telephone Encounter (Signed)
In-basket sent by Lanice SchwabMisty, RN to [facility] to determine in what way to proceed regarding iron infusion. At this time, Dr. Candise CheKale requires continuous monitoring for third trimester patients receiving iron, but [facility] not be [facility] for that duration and procedure.   Awaiting Dr. Waynard EdwardsErvin's response. Pt aware and [facility] cancelled at this time.

## 2017-10-18 NOTE — Telephone Encounter (Signed)
Call received requesting tomorrow's appointment information.  I'm thirty-four weeks pregnant needing to know if I can or cannot receive treatment tomorrow.  Call forwarded to [facility].  Further patient communication from [facility] nurse.

## 2017-10-19 ENCOUNTER — Ambulatory Visit: Payer: Medicaid Other

## 2017-10-20 NOTE — Telephone Encounter (Signed)
Please see PC for further information regarding this matter. [facility]

## 2017-10-24 ENCOUNTER — Other Ambulatory Visit (HOSPITAL_COMMUNITY): Payer: Self-pay | Admitting: *Deleted

## 2017-10-24 ENCOUNTER — Telehealth: Payer: Self-pay

## 2017-10-24 NOTE — Telephone Encounter (Signed)
TC call from Lavern w/ [facility] regarding pt having appt for IV infusion and needed records, Consulted w/Misty and verbal from  Dr.Constant to read orders to them being that they are in the pt chart and have not been released Per Lavern she will make nurse  aware orders are in chart . [facility] CMA

## 2017-10-25 ENCOUNTER — Ambulatory Visit (HOSPITAL_COMMUNITY)
Admission: RE | Admit: 2017-10-25 | Discharge: 2017-10-25 | Disposition: A | Discharge status: Home / Self Care | Payer: Medicaid Other | Source: Ambulatory Visit | Attending: Certified Nurse Midwife | Admitting: Certified Nurse Midwife

## 2017-10-25 DIAGNOSIS — D509 Iron deficiency anemia, unspecified: Secondary | ICD-10-CM | POA: Diagnosis present

## 2017-10-25 MED ORDER — SODIUM CHLORIDE 0.9 % IV SOLN
200.0000 mg | Freq: Once | INTRAVENOUS | Status: AC
  Administered 2017-10-25: 200 mg via INTRAVENOUS
  Filled 2017-10-25: qty 10

## 2017-10-25 NOTE — Discharge Instructions (Signed)
Iron Sucrose injection What is this medicine? IRON SUCROSE (AHY ern SOO krohs) is an iron complex. Iron is used to make healthy red blood cells, which carry oxygen and nutrients throughout the body. This medicine is used to treat iron deficiency anemia in people with chronic kidney disease. This medicine may be used for other purposes; ask your health care provider or pharmacist if you have questions. COMMON BRAND NAME(S): Venofer What should I tell my health care provider before I take this medicine? They need to know if you have any of these conditions: -anemia not caused by low iron levels -heart disease -high levels of iron in the blood -kidney disease -liver disease -an unusual or allergic reaction to iron, other medicines, foods, dyes, or preservatives -pregnant or trying to get pregnant -breast-feeding How should I use this medicine? This medicine is for infusion into a vein. It is given by a health care professional in a hospital or clinic setting. Talk to your pediatrician regarding the use of this medicine in children. While this drug may be prescribed for children as young as 2 years for selected conditions, precautions do apply. Overdosage: If you think you have taken too much of this medicine contact a poison control center or emergency room at once. NOTE: This medicine is only for you. Do not share this medicine with others. What if I miss a dose? It is important not to miss your dose. Call your doctor or health care professional if you are unable to keep an appointment. What may interact with this medicine? Do not take this medicine with any of the following medications: -deferoxamine -dimercaprol -other iron products This medicine may also interact with the following medications: -chloramphenicol -deferasirox This list may not describe all possible interactions. Give your health care provider a list of all the medicines, herbs, non-prescription drugs, or dietary  supplements you use. Also tell them if you smoke, drink alcohol, or use illegal drugs. Some items may interact with your medicine. What should I watch for while using this medicine? Visit your doctor or healthcare professional regularly. Tell your doctor or healthcare professional if your symptoms do not start to get better or if they get worse. You may need blood work done while you are taking this medicine. You may need to follow a special diet. Talk to your doctor. Foods that contain iron include: whole grains/cereals, dried fruits, beans, or peas, leafy green vegetables, and organ meats (liver, kidney). What side effects may I notice from receiving this medicine? Side effects that you should report to your doctor or health care professional as soon as possible: -allergic reactions like skin rash, itching or hives, swelling of the face, lips, or tongue -breathing problems -changes in blood pressure -cough -fast, irregular heartbeat -feeling faint or lightheaded, falls -fever or chills -flushing, sweating, or hot feelings -joint or muscle aches/pains -seizures -swelling of the ankles or feet -unusually weak or tired Side effects that usually do not require medical attention (report to your doctor or health care professional if they continue or are bothersome): -diarrhea -feeling achy -headache -irritation at site where injected -nausea, vomiting -stomach upset -tiredness This list may not describe all possible side effects. Call your doctor for medical advice about side effects. You may report side effects to FDA at 1-800-FDA-1088. Where should I keep my medicine? This drug is given in a hospital or clinic and will not be stored at home. NOTE: This sheet is a summary. It may not cover all possible information. If   you have questions about this medicine, talk to your doctor, pharmacist, or health care provider.  2018 Elsevier/Gold Standard (2011-06-17 17:14:35)  

## 2017-10-26 ENCOUNTER — Ambulatory Visit: Payer: Medicaid Other

## 2017-10-26 VITALS — BP 113/64 | HR 112

## 2017-10-26 DIAGNOSIS — Z0130 Encounter for examination of blood pressure without abnormal findings: Secondary | ICD-10-CM

## 2017-10-26 NOTE — Progress Notes (Signed)
Pt walked into the office stating that she is seeing black and white spots, and feeling lightheaded. Pt has been having visual changes. She does have some swelling. Per Dr. Debroah LoopArnold okay to leave. Pt will monitor sx and bp at home and be evaluated if sx worsen

## 2017-10-31 ENCOUNTER — Ambulatory Visit (INDEPENDENT_AMBULATORY_CARE_PROVIDER_SITE_OTHER): Payer: Medicaid Other | Admitting: Certified Nurse Midwife

## 2017-10-31 ENCOUNTER — Ambulatory Visit (HOSPITAL_COMMUNITY)
Admission: RE | Admit: 2017-10-31 | Discharge: 2017-10-31 | Disposition: A | Discharge status: Home / Self Care | Payer: Medicaid Other | Source: Ambulatory Visit | Attending: Obstetrics and Gynecology | Admitting: Obstetrics and Gynecology

## 2017-10-31 ENCOUNTER — Encounter: Payer: Self-pay | Admitting: Certified Nurse Midwife

## 2017-10-31 ENCOUNTER — Encounter: Payer: Medicaid Other | Admitting: Certified Nurse Midwife

## 2017-10-31 ENCOUNTER — Encounter (HOSPITAL_COMMUNITY): Payer: Self-pay

## 2017-10-31 ENCOUNTER — Other Ambulatory Visit (HOSPITAL_COMMUNITY): Payer: Self-pay | Admitting: *Deleted

## 2017-10-31 VITALS — BP 124/83 | HR 80 | Wt 221.0 lb

## 2017-10-31 DIAGNOSIS — O99013 Anemia complicating pregnancy, third trimester: Secondary | ICD-10-CM

## 2017-10-31 DIAGNOSIS — O0990 Supervision of high risk pregnancy, unspecified, unspecified trimester: Secondary | ICD-10-CM

## 2017-10-31 DIAGNOSIS — Z3A35 35 weeks gestation of pregnancy: Secondary | ICD-10-CM | POA: Insufficient documentation

## 2017-10-31 DIAGNOSIS — R8271 Bacteriuria: Secondary | ICD-10-CM

## 2017-10-31 DIAGNOSIS — D509 Iron deficiency anemia, unspecified: Secondary | ICD-10-CM

## 2017-10-31 NOTE — Progress Notes (Signed)
   PRENATAL VISIT NOTE  Subjective:  Jamie Riggs is a 32 y.o. [phone] at 4660w6d being seen today for ongoing prenatal care.  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  Patient reports no complaints.  Contractions: Irritability. Vag. Bleeding: None.  Movement: Present. Denies leaking of fluid.   The following portions of the patient's history were reviewed and updated as appropriate: allergies, current medications, past family history, past medical history, past social history, past surgical history and problem list. Problem list updated.  Objective:   Vitals:   10/31/17 0941  BP: 124/83  Pulse: 80  Weight: 221 lb (100.2 kg)    Fetal Status: Fetal Heart Rate (bpm): 146; doppler Fundal Height: 41 cm Movement: Present     General:  Alert, oriented and cooperative. Patient is in no acute distress.  Skin: Skin is warm and dry. No rash noted.   Cardiovascular: Normal heart rate noted  Respiratory: Normal respiratory effort, no problems with respiration noted  Abdomen: Soft, gravid, appropriate for gestational age.  Pain/Pressure: Present     Pelvic: Cervical exam deferred        Extremities: Normal range of motion.  Edema: Trace  Mental Status:  Normal mood and affect. Normal behavior. Normal judgment and thought content.   Assessment and Plan:  Pregnancy: [id number] at 3460w6d  1. Supervision of high risk pregnancy, antepartum     Doing well.   Has F/U scheduled for today.  S>D, normal 2 hour OGTT.  Probable LGA infant.    2. Maternal iron deficiency anemia affecting pregnancy in third trimester, antepartum     Had iron transfusion on 10/27/17.    3. GBS bacteriuria     PCN for labor/delivery  Preterm labor symptoms and general obstetric precautions including but not limited to vaginal bleeding, contractions, leaking of fluid and fetal movement were reviewed in detail with the patient. Please refer to After Visit Summary for other counseling recommendations.  Return in about 1 week (around 11/07/2017) for ROB.   Roe Coombsachelle A Tenille Morrill, CNM.

## 2017-10-31 NOTE — Progress Notes (Signed)
ROB  GBS bacteriuria per notes treat in labor. F/U U/S today @ [facility].  Pt walked in Friday for BP check had complaint of seeing spots and became very faint.  Pt had iron infusion last Tuesday.

## 2017-11-02 ENCOUNTER — Ambulatory Visit: Payer: Medicaid Other

## 2017-11-07 ENCOUNTER — Encounter: Payer: Self-pay | Admitting: Certified Nurse Midwife

## 2017-11-07 ENCOUNTER — Ambulatory Visit (INDEPENDENT_AMBULATORY_CARE_PROVIDER_SITE_OTHER): Payer: Medicaid Other | Admitting: Certified Nurse Midwife

## 2017-11-07 ENCOUNTER — Other Ambulatory Visit (HOSPITAL_COMMUNITY)
Admission: RE | Admit: 2017-11-07 | Discharge: 2017-11-07 | Disposition: A | Discharge status: Home / Self Care | Payer: Medicaid Other | Source: Ambulatory Visit | Attending: Certified Nurse Midwife | Admitting: Certified Nurse Midwife

## 2017-11-07 VITALS — BP 133/79 | HR 82 | Wt 218.6 lb

## 2017-11-07 DIAGNOSIS — R768 Other specified abnormal immunological findings in serum: Secondary | ICD-10-CM

## 2017-11-07 DIAGNOSIS — O0993 Supervision of high risk pregnancy, unspecified, third trimester: Secondary | ICD-10-CM | POA: Diagnosis not present

## 2017-11-07 DIAGNOSIS — O0990 Supervision of high risk pregnancy, unspecified, unspecified trimester: Secondary | ICD-10-CM | POA: Diagnosis present

## 2017-11-07 DIAGNOSIS — Z3A36 36 weeks gestation of pregnancy: Secondary | ICD-10-CM | POA: Diagnosis not present

## 2017-11-07 DIAGNOSIS — R8271 Bacteriuria: Secondary | ICD-10-CM

## 2017-11-07 NOTE — Progress Notes (Signed)
Pt c/o entire body itching x couple days.

## 2017-11-07 NOTE — Progress Notes (Signed)
   PRENATAL VISIT NOTE  Subjective:  Jamie Riggs is a 32 y.o. [phone] at 6217w6d being seen today for ongoing prenatal care.  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  Patient reports no bleeding, no leaking and occasional contractions.  Contractions: Irregular. Vag. Bleeding: None.  Movement: Present. Denies leaking of fluid.   The following portions of the patient's history were reviewed and updated as appropriate: allergies, current medications, past family history, past medical history, past social history, past surgical history and problem list. Problem list updated.  Objective:   Vitals:   11/07/17 0846  BP: 133/79  Pulse: 82  Weight: 218 lb 9.6 oz (99.2 kg)    Fetal Status: Fetal Heart Rate (bpm): 142; doppler Fundal Height: 48 cm Movement: Present  Presentation: Vertex  General:  Alert, oriented and cooperative. Patient is in no acute distress.  Skin: Skin is warm and dry. No rash noted.   Cardiovascular: Normal heart rate noted  Respiratory: Normal respiratory effort, no problems with respiration noted  Abdomen: Soft, gravid, appropriate for gestational age.  Pain/Pressure: Present     Pelvic: Cervical exam performed Dilation: 1 Effacement (%): Thick Station: Ballotable  Extremities: Normal range of motion.  Edema: Trace  Mental Status:  Normal mood and affect. Normal behavior. Normal judgment and thought content.   Assessment  and Plan:  Pregnancy: [id number] at 1017w6d  1. Supervision of high risk pregnancy, antepartum      Doing well  2. GBS bacteriuria     PCN for labor/delivery  3. 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  Preterm labor symptoms and general obstetric precautions including but not limited to vaginal bleeding, contractions, leaking of fluid and fetal movement were reviewed in detail with the patient. Please refer to After Visit Summary for other counseling recommendations.  Return in about 1 week (around 11/14/2017) for ROB.   Roe Coombsachelle A Edman Lipsey, CNM

## 2017-11-08 LAB — CERVICOVAGINAL ANCILLARY ONLY
Bacterial vaginitis: POSITIVE — AB
Candida vaginitis: NEGATIVE
Chlamydia: NEGATIVE
NEISSERIA GONORRHEA: NEGATIVE
TRICH (WINDOWPATH): NEGATIVE

## 2017-11-09 ENCOUNTER — Other Ambulatory Visit: Payer: Medicaid Other

## 2017-11-09 ENCOUNTER — Ambulatory Visit: Payer: Medicaid Other | Admitting: Hematology

## 2017-11-10 ENCOUNTER — Other Ambulatory Visit: Payer: Self-pay | Admitting: Certified Nurse Midwife

## 2017-11-10 ENCOUNTER — Other Ambulatory Visit: Payer: Self-pay

## 2017-11-10 ENCOUNTER — Inpatient Hospital Stay (HOSPITAL_COMMUNITY)
Admission: AD | Admit: 2017-11-10 | Discharge: 2017-11-10 | Disposition: A | Discharge status: Home / Self Care | Payer: Medicaid Other | Source: Ambulatory Visit | Attending: Obstetrics & Gynecology | Admitting: Obstetrics & Gynecology

## 2017-11-10 DIAGNOSIS — B9689 Other specified bacterial agents as the cause of diseases classified elsewhere: Secondary | ICD-10-CM

## 2017-11-10 DIAGNOSIS — N760 Acute vaginitis: Principal | ICD-10-CM

## 2017-11-10 DIAGNOSIS — O471 False labor at or after 37 completed weeks of gestation: Secondary | ICD-10-CM

## 2017-11-10 MED ORDER — METRONIDAZOLE 500 MG PO TABS: 500.0000 mg | ORAL_TABLET | Freq: Two times a day (BID) | ORAL | 0 refills | Status: DC

## 2017-11-10 NOTE — MAU Note (Signed)
Pt C/O uc's for the past 2 days, became stronger last night, having pelvic pressure, also bloody show.  Denies LOF.  Reports fetal movement, states it is a little decreased from normal.

## 2017-11-10 NOTE — MAU Note (Signed)
Pt received to MAU for labor evaluation. Pt states that she has been having irregular contractions for two days with a scant bloody show with mucous. Pt states positive fetal movement.  Rates ctx pain 6/10 on pain scale. Carmelina DaneERRI L Dayden Viverette, RN

## 2017-11-10 NOTE — Progress Notes (Signed)
Contact with Dr. Druscilla BrownieNeill. Provider made aware of cervical exam of 3/thick/high and the patient's contractions concerns. No bloody show noted on exam. Instructed to keep patient on the monitor for 20 minutes and then discharge home. Carmelina DaneERRI L Jaycub Noorani, RN

## 2017-11-11 ENCOUNTER — Encounter (HOSPITAL_COMMUNITY): Payer: Self-pay | Admitting: *Deleted

## 2017-11-11 ENCOUNTER — Inpatient Hospital Stay (HOSPITAL_COMMUNITY): Payer: Medicaid Other | Admitting: Anesthesiology

## 2017-11-11 ENCOUNTER — Other Ambulatory Visit: Payer: Self-pay

## 2017-11-11 ENCOUNTER — Inpatient Hospital Stay (HOSPITAL_COMMUNITY)
Admission: AD | Admit: 2017-11-11 | Discharge: 2017-11-13 | DRG: 796 | Disposition: A | Discharge status: Home / Self Care | Payer: Medicaid Other | Source: Ambulatory Visit | Attending: Obstetrics and Gynecology | Admitting: Obstetrics and Gynecology

## 2017-11-11 DIAGNOSIS — O4593 Premature separation of placenta, unspecified, third trimester: Secondary | ICD-10-CM | POA: Diagnosis present

## 2017-11-11 DIAGNOSIS — O4292 Full-term premature rupture of membranes, unspecified as to length of time between rupture and onset of labor: Secondary | ICD-10-CM | POA: Diagnosis present

## 2017-11-11 DIAGNOSIS — Z7982 Long term (current) use of aspirin: Secondary | ICD-10-CM

## 2017-11-11 DIAGNOSIS — O99824 Streptococcus B carrier state complicating childbirth: Secondary | ICD-10-CM | POA: Diagnosis present

## 2017-11-11 DIAGNOSIS — Z87891 Personal history of nicotine dependence: Secondary | ICD-10-CM

## 2017-11-11 DIAGNOSIS — Z3A37 37 weeks gestation of pregnancy: Secondary | ICD-10-CM | POA: Diagnosis not present

## 2017-11-11 DIAGNOSIS — D509 Iron deficiency anemia, unspecified: Secondary | ICD-10-CM | POA: Diagnosis present

## 2017-11-11 DIAGNOSIS — Z302 Encounter for sterilization: Secondary | ICD-10-CM

## 2017-11-11 DIAGNOSIS — R8271 Bacteriuria: Secondary | ICD-10-CM

## 2017-11-11 DIAGNOSIS — O9902 Anemia complicating childbirth: Secondary | ICD-10-CM | POA: Diagnosis present

## 2017-11-11 DIAGNOSIS — O0990 Supervision of high risk pregnancy, unspecified, unspecified trimester: Secondary | ICD-10-CM

## 2017-11-11 DIAGNOSIS — O99214 Obesity complicating childbirth: Secondary | ICD-10-CM | POA: Diagnosis present

## 2017-11-11 HISTORY — DX: Anemia, unspecified: D64.9

## 2017-11-11 LAB — CBC
HEMATOCRIT: 31.2 % — AB (ref 36.0–46.0)
HEMOGLOBIN: 9.3 g/dL — AB (ref 12.0–15.0)
MCH: 23 pg — ABNORMAL LOW (ref 26.0–34.0)
MCHC: 29.8 g/dL — ABNORMAL LOW (ref 30.0–36.0)
MCV: 77 fL — ABNORMAL LOW (ref 78.0–100.0)
Platelets: 356 10*3/uL (ref 150–400)
RBC: 4.05 MIL/uL (ref 3.87–5.11)
RDW: 19.3 % — AB (ref 11.5–15.5)
WBC: 12.2 10*3/uL — AB (ref 4.0–10.5)

## 2017-11-11 MED ORDER — DIPHENHYDRAMINE HCL 50 MG/ML IJ SOLN: 12.5000 mg | INTRAMUSCULAR | Status: DC | PRN

## 2017-11-11 MED ORDER — ONDANSETRON HCL 4 MG/2ML IJ SOLN: 4.0000 mg | Freq: Four times a day (QID) | INTRAMUSCULAR | Status: DC | PRN

## 2017-11-11 MED ORDER — PENICILLIN G POT IN DEXTROSE 60000 UNIT/ML IV SOLN
3.0000 10*6.[IU] | INTRAVENOUS | Status: DC
  Administered 2017-11-11: 3 10*6.[IU] via INTRAVENOUS
  Filled 2017-11-11 (×4): qty 50

## 2017-11-11 MED ORDER — LIDOCAINE HCL (PF) 1 % IJ SOLN
INTRAMUSCULAR | Status: DC | PRN
  Administered 2017-11-11: 6 mL via EPIDURAL

## 2017-11-11 MED ORDER — ACETAMINOPHEN 325 MG PO TABS: 650.0000 mg | ORAL_TABLET | ORAL | Status: DC | PRN

## 2017-11-11 MED ORDER — EPHEDRINE 5 MG/ML INJ
10.0000 mg | INTRAVENOUS | Status: DC | PRN
  Filled 2017-11-11: qty 2

## 2017-11-11 MED ORDER — TERBUTALINE SULFATE 1 MG/ML IJ SOLN
0.2500 mg | Freq: Once | INTRAMUSCULAR | Status: DC | PRN
  Filled 2017-11-11: qty 1

## 2017-11-11 MED ORDER — OXYTOCIN 40 UNITS IN LACTATED RINGERS INFUSION - SIMPLE MED
2.5000 [IU]/h | INTRAVENOUS | Status: DC
  Filled 2017-11-11: qty 1000

## 2017-11-11 MED ORDER — OXYCODONE-ACETAMINOPHEN 5-325 MG PO TABS: 2.0000 | ORAL_TABLET | ORAL | Status: DC | PRN

## 2017-11-11 MED ORDER — LACTATED RINGERS IV SOLN
500.0000 mL | INTRAVENOUS | Status: DC | PRN
Start: 2017-11-11 — End: 2017-11-12

## 2017-11-11 MED ORDER — PHENYLEPHRINE 40 MCG/ML (10ML) SYRINGE FOR IV PUSH (FOR BLOOD PRESSURE SUPPORT): 80.0000 ug | PREFILLED_SYRINGE | INTRAVENOUS | Status: DC | PRN

## 2017-11-11 MED ORDER — FENTANYL CITRATE (PF) 100 MCG/2ML IJ SOLN: 100.0000 ug | INTRAMUSCULAR | Status: DC | PRN

## 2017-11-11 MED ORDER — EPHEDRINE 5 MG/ML INJ: 10.0000 mg | INTRAVENOUS | Status: DC | PRN

## 2017-11-11 MED ORDER — LACTATED RINGERS IV SOLN
INTRAVENOUS | Status: DC
  Administered 2017-11-11 (×2): via INTRAVENOUS

## 2017-11-11 MED ORDER — LACTATED RINGERS IV SOLN: 500.0000 mL | Freq: Once | INTRAVENOUS | Status: DC

## 2017-11-11 MED ORDER — PHENYLEPHRINE 40 MCG/ML (10ML) SYRINGE FOR IV PUSH (FOR BLOOD PRESSURE SUPPORT)
80.0000 ug | PREFILLED_SYRINGE | INTRAVENOUS | Status: DC | PRN
  Filled 2017-11-11: qty 5

## 2017-11-11 MED ORDER — MISOPROSTOL 200 MCG PO TABS
ORAL_TABLET | ORAL | Status: AC
  Administered 2017-11-12: 800 ug
  Filled 2017-11-11: qty 4

## 2017-11-11 MED ORDER — OXYCODONE-ACETAMINOPHEN 5-325 MG PO TABS: 1.0000 | ORAL_TABLET | ORAL | Status: DC | PRN

## 2017-11-11 MED ORDER — SOD CITRATE-CITRIC ACID 500-334 MG/5ML PO SOLN: 30.0000 mL | ORAL | Status: DC | PRN

## 2017-11-11 MED ORDER — PHENYLEPHRINE 40 MCG/ML (10ML) SYRINGE FOR IV PUSH (FOR BLOOD PRESSURE SUPPORT)
80.0000 ug | PREFILLED_SYRINGE | INTRAVENOUS | Status: DC | PRN
Start: 2017-11-11 — End: 2017-11-12
  Filled 2017-11-11: qty 10
  Filled 2017-11-11: qty 5

## 2017-11-11 MED ORDER — OXYTOCIN 40 UNITS IN LACTATED RINGERS INFUSION - SIMPLE MED
1.0000 m[IU]/min | INTRAVENOUS | Status: DC
  Administered 2017-11-11: 2 m[IU]/min via INTRAVENOUS

## 2017-11-11 MED ORDER — SODIUM CHLORIDE 0.9 % IV SOLN
5.0000 10*6.[IU] | Freq: Once | INTRAVENOUS | Status: AC
  Administered 2017-11-11: 5 10*6.[IU] via INTRAVENOUS
  Filled 2017-11-11: qty 5

## 2017-11-11 MED ORDER — OXYTOCIN BOLUS FROM INFUSION
500.0000 mL | Freq: Once | INTRAVENOUS | Status: AC
  Administered 2017-11-12: 500 mL via INTRAVENOUS

## 2017-11-11 MED ORDER — FENTANYL 2.5 MCG/ML BUPIVACAINE 1/10 % EPIDURAL INFUSION (WH - ANES)
14.0000 mL/h | INTRAMUSCULAR | Status: DC | PRN
  Administered 2017-11-11: 14 mL/h via EPIDURAL
  Filled 2017-11-11: qty 100

## 2017-11-11 MED ORDER — LIDOCAINE HCL (PF) 1 % IJ SOLN
30.0000 mL | INTRAMUSCULAR | Status: DC | PRN
  Filled 2017-11-11: qty 30

## 2017-11-11 MED ORDER — FLEET ENEMA 7-19 GM/118ML RE ENEM: 1.0000 | ENEMA | RECTAL | Status: DC | PRN

## 2017-11-11 NOTE — Anesthesia Pain Management Evaluation Note (Signed)
  CRNA Pain Management Visit Note  Patient: Jamie Riggs, 32 y.o., female  "Hello I am a member of the anesthesia team at [facility]. We have an anesthesia team available at all times to provide care throughout the hospital, including epidural management and anesthesia for C-section. I don't know your plan for the delivery whether it a natural birth, water birth, IV sedation, nitrous supplementation, doula or epidural, but we want to meet your pain goals."   1.Was your pain managed to your expectations on prior hospitalizations?   Yes   2.What is your expectation for pain management during this hospitalization?     Epidural  3.How can we help you reach that goal? epidural  Record the patient's initial score and the patient's pain goal.   Pain: 9  Pain Goal: 9 [facility] wants you to be able to say your pain was always managed very well.  Jamie Riggs 11/11/2017

## 2017-11-11 NOTE — Progress Notes (Signed)
Labor Progress Note Jamie ForsterJessica Riggs is a 32 y.o. [phone] at 7080w3d presented for SOL.  S: Patient starting to feel some pressure, pain improved on epidural.  O:  BP 127/70   Pulse (!) 102   Temp 98.6 F (37 C) (Oral)   Resp 18   Ht 5\' 1"  (1.549 m)   Wt 222 lb (100.7 kg)   LMP 02/22/2017 (LMP Unknown)   SpO2 98%   BMI 41.95 kg/m   HKV:QQVZDGLOFHT:baseline rate 145, moderate variability, + acels, early decels Toco: ctx every 2-3 min  CVE: Dilation: 5 Effacement (%): 90 Cervical Position: Anterior Station: -2 Presentation: Vertex Exam by:: N Deal RN  A&P: 32 y.o. [id number] 7280w3d here for SOL. #Labor: Progressing well. Start Pitocin for augmentation. #Pain: Epidural #FWB: Cat I #GBS positive, PCN  Ellwood DenseAlison Saliah Crisp, DO 9:46 PM

## 2017-11-11 NOTE — Anesthesia Procedure Notes (Signed)
Epidural Patient location during procedure: OB Start time: 11/11/2017 7:48 PM End time: 11/11/2017 8:06 PM  Staffing Anesthesiologist: Trevor IhaHouser, Mesiah Manzo A, MD Performed: anesthesiologist   Preanesthetic Checklist Completed: patient identified, site marked, surgical consent, pre-op evaluation, timeout performed, IV checked, risks and benefits discussed and monitors and equipment checked  Epidural Patient position: sitting Prep: site prepped and draped and DuraPrep Patient monitoring: continuous pulse ox and blood pressure Approach: midline Injection technique: LOR air  Needle:  Needle type: Tuohy  Needle gauge: 17 G Needle length: 9 cm and 9 Needle insertion depth: 5 cm cm Catheter type: closed end flexible Catheter size: 19 Gauge Catheter at skin depth: 10 cm Test dose: negative  Assessment Events: blood not aspirated, injection not painful, no injection resistance, negative IV test and no paresthesia

## 2017-11-11 NOTE — H&P (Signed)
Obstetric History and Physical  Jamie Riggs is a 32 y.o. [phone] with IUP at [redacted]w[redacted]d presenting for SROM around 1500. Patient states she has been having  Irregular contractions, none vaginal bleeding, ruptured, clear fluid membranes, with active fetal movement.  No blurry vision, headaches or peripheral edema, and RUQ pain.   Prenatal Course Source of Care: [facility] Dating: By early [place] --->  Estimated Date of Delivery: 11/29/17 Pregnancy complications or risks: -Red cell antibody positive -h/o PPH, GDM, preE -anemia s/p iron transfusion Patient Active Problem List   Diagnosis Date Noted  . Indication for care in labor or delivery 11/11/2017  . Unwanted fertility 10/17/2017  . GBS bacteriuria 10/01/2017  . Blurred vision, left eye 09/30/2017  . Maternal iron deficiency anemia affecting pregnancy in third trimester, antepartum 08/26/2017  . Pica in adults 08/10/2017  . H/O postpartum hemorrhage, currently pregnant 06/15/2017  . 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 Low hemoglobin A2 level in blood 05/17/2017  . Supervision of high risk pregnancy, antepartum 05/05/2017  . History of pre-eclampsia in prior pregnancy, currently pregnant in first trimester 05/05/2017  . History of gestational diabetes 05/05/2017   She plans to breastfeed She desires bilateral tubal ligation for postpartum contraception.   Sono:    @[redacted]w[redacted]d , CWD, normal anatomy, cephalic presentation, posterior placenta, 3567g, >90% EFW  Prenatal labs and studies: ABO, Rh: A/Positive/-- (08/16 0910) Antibody: Positive, See Final Results (08/16 0910) Rubella: 2.67 (08/16 0910) RPR: Non Reactive (12/20 1030)  HBsAg: Negative (08/16 0910)  HIV: Non Reactive (12/20 1030)  GBS: POSITIVE 2 hr Glucola  Normal Genetic screening normal Anatomy US normal  Prenatal Transfer Tool  Maternal Diabetes: No Genetic Screening: Normal Maternal Ultrasounds/Referrals: Normal Fetal  Ultrasounds or other Referrals:  None Maternal Substance Abuse:  No Significant Maternal Medications:  None Significant Maternal Lab Results: Lab values include: Group B Strep positive, Other: Antibody positive  Past Medical History:  Diagnosis Date  . Ectopic pregnancy 2017  . Gestational diabetes   . Pregnancy induced hypertension     Past Surgical History:  Procedure Laterality Date  . WISDOM TOOTH EXTRACTION  2018    OB History  Gravida Para Term Preterm AB Living  6 3 3   2 3   SAB TAB Ectopic Multiple Live Births  1   1   3     # Outcome Date GA Lbr Len/2nd Weight Sex Delivery Anes PTL Lv  6 Current           5 Term 11/10/16 [redacted]w[redacted]d  3.402 kg (7 lb 8 oz) F Vag-Spont EPI  LIV     Complications: Hemorrhage  4 Ectopic 2017          3 Term 06/15/12 [redacted]w[redacted]d  3.402 kg (7 lb 8 oz) M Vag-Spont   LIV     Complications: Gestational diabetes  2 Term 12/15/04 [redacted]w[redacted]d  3.402 kg (7 lb 8 oz) M Vag-Spont   LIV     Complications: Preeclampsia  1 SAB 2005              Social History   Socioeconomic History  . Marital status: Legally Separated    Spouse name: None  . Number of children: None  . Years of education: None  . Highest education level: None  Social Needs  . Financial resource strain: None  . Food insecurity - worry: None  . Food insecurity - inability: None  . Transportation needs - medical: None  .  Transportation needs - non-medical: None  Occupational History  . None  Tobacco Use  . Smoking status: Former Games developer  . Smokeless tobacco: Never Used  Substance and Sexual Activity  . Alcohol use: No  . Drug use: No  . Sexual activity: Yes    Partners: Male    Birth control/protection: None  Other Topics Concern  . None  Social History Narrative  . None    Family History  Problem Relation Age of Onset  . Diabetes Mother   . Hypertension Father   . Diabetes Maternal Grandmother   . Cancer Paternal Grandmother     Medications Prior to Admission  Medication  Sig Dispense Refill Last Dose  . aspirin 81 MG chewable tablet Chew 1 tablet (81 mg total) by mouth daily. 30 tablet 5 11/10/2017 at Unknown time  . cyclobenzaprine (FLEXERIL) 10 MG tablet Take 5-10 mg by mouth as needed for muscle spasms.    Past Week at Unknown time  . docusate sodium (COLACE) 50 MG capsule Take 50 mg by mouth daily.   11/10/2017 at Unknown time  . iron polysaccharides (NIFEREX) 150 MG capsule Take 1 capsule (150 mg total) by mouth 2 (two) times daily. 60 capsule 3 11/10/2017 at Unknown time  . Iron-FA-B Cmp-C-Biot-Probiotic (FUSION PLUS) CAPS Take 1 capsule by mouth daily before breakfast. 30 capsule 5 11/10/2017 at Unknown time  . metroNIDAZOLE (FLAGYL) 500 MG tablet Take 1 tablet (500 mg total) by mouth 2 (two) times daily. 14 tablet 0   . Prenat-FePoly-Metf-FA-DHA-DSS (VITAFOL FE+) 90-1-200 & 50 MG CPPK Take 2 tablets by mouth daily before breakfast. (Patient taking differently: Take 1 tablet by mouth daily before breakfast. ) 60 each 11 11/10/2017 at Unknown time    No Known Allergies  Review of Systems: Negative except for what is mentioned in HPI.  Physical Exam: BP 127/78   Pulse 69   Temp 97.6 F (36.4 C) (Oral)   Resp 18   Ht 5\' 1"  (1.549 m)   Wt 100.7 kg (222 lb)   LMP 02/22/2017 (LMP Unknown)   BMI 41.95 kg/m  CONSTITUTIONAL: Well-developed, well-nourished female in no acute distress.  HENT:  Normocephalic, atraumatic, External right and left ear normal. Oropharynx is clear and moist EYES: Conjunctivae and EOM are normal. Pupils are equal, round, and reactive to light. No scleral icterus.  NECK: Normal range of motion, supple, no masses SKIN: Skin is warm and dry. No rash noted. Not diaphoretic. No erythema. No pallor. NEUROLOGIC: Alert and oriented to person, place, and time. Normal reflexes, muscle tone coordination. No cranial nerve deficit noted. PSYCHIATRIC: Normal mood and affect. Normal behavior. Normal judgment and thought content. CARDIOVASCULAR:  Normal heart rate noted, regular rhythm RESPIRATORY: Effort and breath sounds normal, no problems with respiration noted ABDOMEN: Soft, nontender, nondistended, gravid. MUSCULOSKELETAL: Normal range of motion. No edema and no tenderness. 2+ distal pulses.  Cervical Exam: Dilation: 3 Effacement (%): 70 Cervical Position: Anterior Station: -1, -2 Presentation: Vertex Exam by:: shannon earl  FHT:  Baseline rate 140 bpm   Variability moderate  Accelerations absent   Decelerations none Contractions: Every 2-5 mins   Pertinent Labs/Studies:   No results found for this or any previous visit (from the past 24 hour(s)).  Assessment : Jamie Riggs is a 32 y.o. [phone] at [redacted]w[redacted]d being admitted for labor SROM.  Plan: Labor: Expectant management. Early labor. Will augment as needed Analgesia as needed. FWB: Reassuring fetal heart tracing. GBS positive - start PCN Delivery plan: Hopeful  for vaginal delivery    Caryl AdaJazma Phelps, DO OB Fellow Faculty Practice, [facility] [place] 11/11/2017, 4:10 PM

## 2017-11-11 NOTE — Anesthesia Preprocedure Evaluation (Signed)
Anesthesia Evaluation  Patient identified by MRN, date of birth, ID band Patient awake    Reviewed: Allergy & Precautions, NPO status , Patient's Chart, lab work & pertinent test results  Airway Mallampati: II  TM Distance: >3 FB Neck ROM: Full    Dental no notable dental hx.    Pulmonary former smoker,    Pulmonary exam normal breath sounds clear to auscultation       Cardiovascular negative cardio ROS Normal cardiovascular exam Rhythm:Regular Rate:Normal     Neuro/Psych    GI/Hepatic   Endo/Other    Renal/GU      Musculoskeletal   Abdominal   Peds negative pediatric ROS (+)  Hematology   Anesthesia Other Findings   Reproductive/Obstetrics (+) Pregnancy                             Lab Results  Component Value Date   WBC 12.2 (H) 11/11/2017   HGB 9.3 (L) 11/11/2017   HCT 31.2 (L) 11/11/2017   MCV 77.0 (L) 11/11/2017   PLT 356 11/11/2017    Anesthesia Physical Anesthesia Plan  ASA: III  Anesthesia Plan: Epidural   Post-op Pain Management:    Induction:   PONV Risk Score and Plan:   Airway Management Planned:   Additional Equipment:   Intra-op Plan:   Post-operative Plan:   Informed Consent: I have reviewed the patients History and Physical, chart, labs and discussed the procedure including the risks, benefits and alternatives for the proposed anesthesia with the patient or authorized representative who has indicated his/her understanding and acceptance.     Plan Discussed with:   Anesthesia Plan Comments:         Anesthesia Quick Evaluation

## 2017-11-12 ENCOUNTER — Encounter (HOSPITAL_COMMUNITY)
Admission: AD | Disposition: A | Discharge status: Home / Self Care | Payer: Self-pay | Source: Ambulatory Visit | Attending: Obstetrics and Gynecology

## 2017-11-12 ENCOUNTER — Inpatient Hospital Stay (HOSPITAL_COMMUNITY): Payer: Medicaid Other | Admitting: Anesthesiology

## 2017-11-12 ENCOUNTER — Encounter (HOSPITAL_COMMUNITY): Payer: Self-pay | Admitting: Obstetrics & Gynecology

## 2017-11-12 DIAGNOSIS — O9902 Anemia complicating childbirth: Secondary | ICD-10-CM

## 2017-11-12 DIAGNOSIS — O99824 Streptococcus B carrier state complicating childbirth: Secondary | ICD-10-CM

## 2017-11-12 DIAGNOSIS — Z302 Encounter for sterilization: Secondary | ICD-10-CM

## 2017-11-12 DIAGNOSIS — Z3A37 37 weeks gestation of pregnancy: Secondary | ICD-10-CM

## 2017-11-12 HISTORY — PX: TUBAL LIGATION: SHX77

## 2017-11-12 LAB — RPR: RPR Ser Ql: NONREACTIVE

## 2017-11-12 SURGERY — LIGATION, FALLOPIAN TUBE, POSTPARTUM
Anesthesia: Epidural | Laterality: Bilateral

## 2017-11-12 MED ORDER — TETANUS-DIPHTH-ACELL PERTUSSIS 5-2.5-18.5 LF-MCG/0.5 IM SUSP
0.5000 mL | Freq: Once | INTRAMUSCULAR | Status: DC
  Filled 2017-11-12: qty 0.5

## 2017-11-12 MED ORDER — METOCLOPRAMIDE HCL 10 MG PO TABS
10.0000 mg | ORAL_TABLET | Freq: Once | ORAL | Status: AC
  Administered 2017-11-12: 10 mg via ORAL
  Filled 2017-11-12: qty 1

## 2017-11-12 MED ORDER — SIMETHICONE 80 MG PO CHEW: 80.0000 mg | CHEWABLE_TABLET | ORAL | Status: DC | PRN

## 2017-11-12 MED ORDER — ONDANSETRON HCL 4 MG/2ML IJ SOLN
INTRAMUSCULAR | Status: DC | PRN
  Administered 2017-11-12: 4 mg via INTRAVENOUS

## 2017-11-12 MED ORDER — DEXAMETHASONE SODIUM PHOSPHATE 4 MG/ML IJ SOLN
INTRAMUSCULAR | Status: DC | PRN
  Administered 2017-11-12: 4 mg via INTRAVENOUS

## 2017-11-12 MED ORDER — MIDAZOLAM HCL 2 MG/2ML IJ SOLN
INTRAMUSCULAR | Status: AC
  Filled 2017-11-12: qty 2

## 2017-11-12 MED ORDER — ONDANSETRON HCL 4 MG/2ML IJ SOLN
INTRAMUSCULAR | Status: AC
  Filled 2017-11-12: qty 2

## 2017-11-12 MED ORDER — ONDANSETRON HCL 4 MG/2ML IJ SOLN: 4.0000 mg | INTRAMUSCULAR | Status: DC | PRN

## 2017-11-12 MED ORDER — ONDANSETRON HCL 4 MG PO TABS: 4.0000 mg | ORAL_TABLET | ORAL | Status: DC | PRN

## 2017-11-12 MED ORDER — FENTANYL CITRATE (PF) 100 MCG/2ML IJ SOLN: 25.0000 ug | INTRAMUSCULAR | Status: DC | PRN

## 2017-11-12 MED ORDER — PHENYLEPHRINE HCL 10 MG/ML IJ SOLN
INTRAMUSCULAR | Status: DC | PRN
  Administered 2017-11-12 (×2): 80 ug via INTRAVENOUS

## 2017-11-12 MED ORDER — LIDOCAINE-EPINEPHRINE (PF) 2 %-1:200000 IJ SOLN
INTRAMUSCULAR | Status: AC
  Filled 2017-11-12: qty 20

## 2017-11-12 MED ORDER — MISOPROSTOL 200 MCG PO TABS: 800.0000 ug | ORAL_TABLET | Freq: Once | ORAL | Status: DC

## 2017-11-12 MED ORDER — LACTATED RINGERS IV SOLN
INTRAVENOUS | Status: DC
  Administered 2017-11-12: 07:00:00 via INTRAVENOUS

## 2017-11-12 MED ORDER — MEPERIDINE HCL 25 MG/ML IJ SOLN: 6.2500 mg | INTRAMUSCULAR | Status: DC | PRN

## 2017-11-12 MED ORDER — SODIUM CHLORIDE 0.9 % IR SOLN
Status: DC | PRN
  Administered 2017-11-12: 1000 mL

## 2017-11-12 MED ORDER — LACTATED RINGERS IV SOLN
INTRAVENOUS | Status: DC | PRN
  Administered 2017-11-12 (×3): via INTRAVENOUS

## 2017-11-12 MED ORDER — ZOLPIDEM TARTRATE 5 MG PO TABS: 5.0000 mg | ORAL_TABLET | Freq: Every evening | ORAL | Status: DC | PRN

## 2017-11-12 MED ORDER — BUPIVACAINE HCL (PF) 0.5 % IJ SOLN
INTRAMUSCULAR | Status: DC | PRN
  Administered 2017-11-12: 30 mL

## 2017-11-12 MED ORDER — SENNOSIDES-DOCUSATE SODIUM 8.6-50 MG PO TABS
2.0000 | ORAL_TABLET | ORAL | Status: DC
  Administered 2017-11-12: 2 via ORAL
  Filled 2017-11-12: qty 2

## 2017-11-12 MED ORDER — SODIUM BICARBONATE 8.4 % IV SOLN
INTRAVENOUS | Status: AC
  Filled 2017-11-12: qty 50

## 2017-11-12 MED ORDER — SODIUM BICARBONATE 8.4 % IV SOLN
INTRAVENOUS | Status: DC | PRN
  Administered 2017-11-12: 7 mL via EPIDURAL
  Administered 2017-11-12: 3 mL via EPIDURAL
  Administered 2017-11-12: 5 mL via EPIDURAL

## 2017-11-12 MED ORDER — IBUPROFEN 600 MG PO TABS
600.0000 mg | ORAL_TABLET | Freq: Four times a day (QID) | ORAL | Status: DC
  Administered 2017-11-12 – 2017-11-13 (×6): 600 mg via ORAL
  Filled 2017-11-12 (×6): qty 1

## 2017-11-12 MED ORDER — FENTANYL CITRATE (PF) 100 MCG/2ML IJ SOLN
INTRAMUSCULAR | Status: AC
  Filled 2017-11-12: qty 2

## 2017-11-12 MED ORDER — DIPHENHYDRAMINE HCL 25 MG PO CAPS: 25.0000 mg | ORAL_CAPSULE | Freq: Four times a day (QID) | ORAL | Status: DC | PRN

## 2017-11-12 MED ORDER — WITCH HAZEL-GLYCERIN EX PADS: 1.0000 "application " | MEDICATED_PAD | CUTANEOUS | Status: DC | PRN

## 2017-11-12 MED ORDER — OXYCODONE HCL 5 MG PO TABS
5.0000 mg | ORAL_TABLET | ORAL | Status: DC | PRN
  Administered 2017-11-12 – 2017-11-13 (×4): 10 mg via ORAL
  Filled 2017-11-12 (×4): qty 2

## 2017-11-12 MED ORDER — DIBUCAINE 1 % RE OINT: 1.0000 "application " | TOPICAL_OINTMENT | RECTAL | Status: DC | PRN

## 2017-11-12 MED ORDER — ACETAMINOPHEN 325 MG PO TABS
650.0000 mg | ORAL_TABLET | ORAL | Status: DC | PRN
  Administered 2017-11-12: 650 mg via ORAL
  Filled 2017-11-12: qty 2

## 2017-11-12 MED ORDER — COCONUT OIL OIL: 1.0000 "application " | TOPICAL_OIL | Status: DC | PRN

## 2017-11-12 MED ORDER — LACTATED RINGERS IV SOLN: INTRAVENOUS | Status: DC

## 2017-11-12 MED ORDER — FAMOTIDINE 20 MG PO TABS
40.0000 mg | ORAL_TABLET | Freq: Once | ORAL | Status: AC
  Administered 2017-11-12: 40 mg via ORAL
  Filled 2017-11-12: qty 2

## 2017-11-12 MED ORDER — FENTANYL CITRATE (PF) 100 MCG/2ML IJ SOLN
INTRAMUSCULAR | Status: DC | PRN
  Administered 2017-11-12 (×2): 50 ug via INTRAVENOUS
  Administered 2017-11-12: 50 ug via EPIDURAL
  Administered 2017-11-12: 50 ug via INTRAVENOUS

## 2017-11-12 MED ORDER — PRENATAL MULTIVITAMIN CH
1.0000 | ORAL_TABLET | Freq: Every day | ORAL | Status: DC
  Administered 2017-11-12 – 2017-11-13 (×2): 1 via ORAL
  Filled 2017-11-12 (×2): qty 1

## 2017-11-12 MED ORDER — BUPIVACAINE HCL (PF) 0.5 % IJ SOLN
INTRAMUSCULAR | Status: AC
  Filled 2017-11-12: qty 30

## 2017-11-12 MED ORDER — METOCLOPRAMIDE HCL 5 MG/ML IJ SOLN: 10.0000 mg | Freq: Once | INTRAMUSCULAR | Status: DC | PRN

## 2017-11-12 MED ORDER — MIDAZOLAM HCL 5 MG/5ML IJ SOLN
INTRAMUSCULAR | Status: DC | PRN
  Administered 2017-11-12 (×2): 1 mg via INTRAVENOUS

## 2017-11-12 MED ORDER — EPHEDRINE SULFATE 50 MG/ML IJ SOLN
INTRAMUSCULAR | Status: DC | PRN
  Administered 2017-11-12: 80 mg via INTRAVENOUS

## 2017-11-12 MED ORDER — BENZOCAINE-MENTHOL 20-0.5 % EX AERO: 1.0000 "application " | INHALATION_SPRAY | CUTANEOUS | Status: DC | PRN

## 2017-11-12 SURGICAL SUPPLY — 26 items
BENZOIN TINCTURE PRP APPL 2/3 (GAUZE/BANDAGES/DRESSINGS) ×3 IMPLANT
BLADE SURG 11 STRL SS (BLADE) ×3 IMPLANT
CLIP FILSHIE TUBAL LIGA STRL (Clip) ×3 IMPLANT
CLOSURE STERI STRIP 1/2 X4 (GAUZE/BANDAGES/DRESSINGS) ×3 IMPLANT
CLOTH BEACON ORANGE TIMEOUT ST (SAFETY) ×3 IMPLANT
DRSG OPSITE POSTOP 3X4 (GAUZE/BANDAGES/DRESSINGS) ×3 IMPLANT
DURAPREP 26ML APPLICATOR (WOUND CARE) ×3 IMPLANT
ELECT REM PT RETURN 9FT ADLT (ELECTROSURGICAL) ×3
ELECTRODE REM PT RTRN 9FT ADLT (ELECTROSURGICAL) ×1 IMPLANT
GLOVE BIOGEL PI IND STRL 7.0 (GLOVE) ×3 IMPLANT
GLOVE BIOGEL PI INDICATOR 7.0 (GLOVE) ×6
GLOVE ECLIPSE 7.0 STRL STRAW (GLOVE) ×3 IMPLANT
GOWN STRL REUS W/TWL LRG LVL3 (GOWN DISPOSABLE) ×6 IMPLANT
NEEDLE HYPO 22GX1.5 SAFETY (NEEDLE) ×3 IMPLANT
NS IRRIG 1000ML POUR BTL (IV SOLUTION) ×3 IMPLANT
PACK ABDOMINAL MINOR (CUSTOM PROCEDURE TRAY) ×3 IMPLANT
PENCIL BUTTON HOLSTER BLD 10FT (ELECTRODE) ×3 IMPLANT
PROTECTOR NERVE ULNAR (MISCELLANEOUS) ×3 IMPLANT
SPONGE LAP 4X18 X RAY DECT (DISPOSABLE) IMPLANT
SUT VIC AB 0 CT1 27 (SUTURE) ×2
SUT VIC AB 0 CT1 27XBRD ANBCTR (SUTURE) ×1 IMPLANT
SUT VICRYL 4-0 PS2 18IN ABS (SUTURE) ×3 IMPLANT
SYR CONTROL 10ML LL (SYRINGE) ×3 IMPLANT
TOWEL OR 17X24 6PK STRL BLUE (TOWEL DISPOSABLE) ×6 IMPLANT
TRAY FOLEY CATH SILVER 14FR (SET/KITS/TRAYS/PACK) ×3 IMPLANT
WATER STERILE IRR 1000ML POUR (IV SOLUTION) ×3 IMPLANT

## 2017-11-12 NOTE — Anesthesia Postprocedure Evaluation (Signed)
Anesthesia Post Note  Patient: Jamie ForsterJessica Riggs  Procedure(s) Performed: AN AD HOC LABOR EPIDURAL     Anesthesia Type: Epidural Level of consciousness: awake and alert Pain management: pain level controlled Vital Signs Assessment: post-procedure vital signs reviewed and stable Respiratory status: spontaneous breathing Cardiovascular status: stable Postop Assessment: no headache, patient able to bend at knees, no backache, no apparent nausea or vomiting, epidural receding and adequate PO intake Anesthetic complications: no    Last Vitals:  Vitals:   11/12/17 0443 11/12/17 0817  BP: 117/63 123/71  Pulse: 68 61  Resp: 16 16  Temp: 36.9 C 36.9 C  SpO2: 99%     Last Pain:  Vitals:   11/12/17 0817  TempSrc: Oral  PainSc: 0-No pain   Pain Goal: Patients Stated Pain Goal: 0 (11/11/17 1925)               Salome ArntSterling, Jeanna Giuffre Marie

## 2017-11-12 NOTE — Progress Notes (Addendum)
POSTPARTUM PROGRESS NOTE  Post Partum Day #0 Subjective:  Jamie Riggs is a 32 y.o. [id number] 6313w4d s/p SVD.  No acute events overnight.  Pt denies problems with ambulating or voiding. Currently NPO for BTL this morning. She denies nausea or vomiting.  Pain is well controlled. Lochia Moderate.   Objective: Blood pressure 117/63, pulse 68, temperature 98.4 F (36.9 C), temperature source Oral, resp. rate 16, height 5\' 1"  (1.549 m), weight 222 lb (100.7 kg), last menstrual period 02/22/2017, SpO2 99 %.  Physical Exam:  General: alert, cooperative and no distress Lochia:normal flow Chest: no respiratory distress Heart:regular rate, distal pulses intact Abdomen: soft,   Uterine Fundus: firm, appropriately tender DVT Evaluation: No calf swelling or tenderness Extremities: no edema  Recent Labs    11/11/17 1610  HGB 9.3*  HCT 31.2*    Assessment/Plan:  ASSESSMENT: Jamie Riggs is a 32 y.o. [id number] 4513w4d s/p SVD, doing well.  Plan for BTL this morning, NPO for now. Breastfeeding    LOS: 1 day   Ellwood Denselison Rumball, DO 11/12/2017, 6:48 AM     Attestation of Attending Supervision of Resident: Evaluation and management procedures were performed by the [facility] under my supervision. I confirm that I have verified the information documented in the resident's note, and that I have also personally reperformed the physical exam and all medical decision making activities.  Patient desires permanent sterilization.  Other reversible forms of contraception were discussed with patient; she declines all other modalities. Risks of procedure discussed with patient including but not limited to: risk of regret, permanence of method, bleeding, infection, injury to surrounding organs and need for additional procedures.  Failure risk of 1-2 % with increased risk of ectopic gestation if pregnancy occurs was also discussed with patient.  Patient verbalized understanding of these risks  and wants to proceed with sterilization.  Written informed consent obtained.  To OR when ready.   Jaynie CollinsUGONNA  Railee Bonillas, MD, FACOG Attending Obstetrician & Gynecologist Faculty Practice, [facility] [place]

## 2017-11-12 NOTE — Transfer of Care (Signed)
Immediate Anesthesia Transfer of Care Note  Patient: Jamie Riggs  Procedure(s) Performed: POST PARTUM TUBAL LIGATION (Bilateral )  Patient Location: PACU  Anesthesia Type:Epidural  Level of Consciousness: awake, alert  and oriented  Airway & Oxygen Therapy: Patient Spontanous Breathing  Post-op Assessment: Report given to RN and Post -op Vital signs reviewed and stable  Post vital signs: Reviewed and stable  Last Vitals:  Vitals:   11/12/17 0443 11/12/17 0817  BP: 117/63 123/71  Pulse: 68 61  Resp: 16 16  Temp: 36.9 C 36.9 C  SpO2: 99%     Last Pain:0 Vitals:   11/12/17 0817  TempSrc: Oral  PainSc: 0-No pain      Patients Stated Pain Goal: 0 (11/11/17 1925)  Complications: No apparent anesthesia complications

## 2017-11-12 NOTE — Anesthesia Postprocedure Evaluation (Signed)
Anesthesia Post Note  Patient: Jamie ForsterJessica Riggs  Procedure(s) Performed: POST PARTUM TUBAL LIGATION (Bilateral )     Patient location during evaluation: Mother Baby Anesthesia Type: Epidural Level of consciousness: awake and alert, oriented and patient cooperative Pain management: pain level controlled Vital Signs Assessment: post-procedure vital signs reviewed and stable Respiratory status: spontaneous breathing Cardiovascular status: stable Postop Assessment: no headache, epidural receding, patient able to bend at knees and no signs of nausea or vomiting Anesthetic complications: no Comments: Pain score 3.    Last Vitals:  Vitals:   11/12/17 1229 11/12/17 1317  BP: 118/73 119/76  Pulse: 62   Resp: 15 16  Temp: 37 C 36.4 C  SpO2: 98%     Last Pain:  Vitals:   11/12/17 1317  TempSrc: Oral  PainSc:    Pain Goal: Patients Stated Pain Goal: 3 (11/12/17 1230)               Merrilyn PumaWRINKLE,Caidence Higashi

## 2017-11-12 NOTE — Anesthesia Preprocedure Evaluation (Signed)
Anesthesia Evaluation  Patient identified by MRN, date of birth, ID band Patient awake    Reviewed: Allergy & Precautions, NPO status , Patient's Chart, lab work & pertinent test results  Airway Mallampati: II  TM Distance: >3 FB Neck ROM: Full    Dental no notable dental hx.    Pulmonary neg pulmonary ROS, former smoker,    Pulmonary exam normal breath sounds clear to auscultation       Cardiovascular negative cardio ROS Normal cardiovascular exam Rhythm:Regular Rate:Normal     Neuro/Psych negative neurological ROS  negative psych ROS   GI/Hepatic negative GI ROS, Neg liver ROS,   Endo/Other  Morbid obesity  Renal/GU negative Renal ROS  negative genitourinary   Musculoskeletal negative musculoskeletal ROS (+)   Abdominal   Peds negative pediatric ROS (+)  Hematology negative hematology ROS (+)   Anesthesia Other Findings   Reproductive/Obstetrics negative OB ROS                             Anesthesia Physical Anesthesia Plan  ASA: III  Anesthesia Plan: Epidural   Post-op Pain Management:    Induction:   PONV Risk Score and Plan: 0 and Treatment may vary due to age or medical condition  Airway Management Planned: Natural Airway  Additional Equipment:   Intra-op Plan:   Post-operative Plan:   Informed Consent: I have reviewed the patients History and Physical, chart, labs and discussed the procedure including the risks, benefits and alternatives for the proposed anesthesia with the patient or authorized representative who has indicated his/her understanding and acceptance.   Dental advisory given  Plan Discussed with:   Anesthesia Plan Comments: (Existing labor epidural)        Anesthesia Quick Evaluation

## 2017-11-12 NOTE — Op Note (Signed)
Jamie ForsterJessica Riggs 11/12/2017  PREOPERATIVE DIAGNOSES: Multiparity, undesired fertility  POSTOPERATIVE DIAGNOSES: Multiparity, undesired fertility  PROCEDURE:  Postpartum Bilateral Tubal Sterilization using Filshie Clips   SURGEON: Dr.  Jaynie CollinsUgonna Anyanwu  ANESTHESIA:  Epidural and local analgesia using 30 ml of 0.5% Marcaine  COMPLICATIONS:  None immediate.  ESTIMATED BLOOD LOSS: 25 ml.  INDICATIONS:  32 y.o. [id number] with undesired fertility,status post vaginal delivery, desires permanent sterilization.  Other reversible forms of contraception were discussed with patient; she declines all other modalities. Risks of procedure discussed with patient including but not limited to: risk of regret, permanence of method, bleeding, infection, injury to surrounding organs and need for additional procedures.  Failure risk of 1 -2 % with increased risk of ectopic gestation if pregnancy occurs was also discussed with patient.      FINDINGS:  Normal uterus, tubes, and ovaries.  PROCEDURE DETAILS: The patient was taken to the operating room where her epidural anesthesia was dosed up to surgical level and found to be adequate.  She was then placed in the dorsal supine position and prepped and draped in sterile fashion.  After an adequate timeout was performed, attention was turned to the patient's abdomen where a small transverse skin incision was made under the umbilical fold. The incision was taken down to the layer of fascia using the scalpel, and fascia was incised, and extended bilaterally using Mayo scissors. The peritoneum was entered in a sharp fashion. Attention was then turned to the patient's uterus, and left fallopian tube was identified and followed out to the fimbriated end.  A Filshie clip was placed on the left fallopian tube about 3 cm from the cornual attachment, with care given to incorporate the underlying mesosalpinx.  A similar process was carried out on the right side allowing for bilateral  tubal sterilization.  Good hemostasis was noted overall.  Local analgesia was injected into both Filshie application sites.The instruments were then removed from the patient's abdomen and the fascial incision was repaired with 0 Vicryl, and the skin was closed with a 4-0 Vicryl subcuticular stitch. The patient tolerated the procedure well.  Instrument, sponge, and needle counts were correct times two.  The patient was then taken to the recovery room awake and in stable condition.   Jaynie CollinsUGONNA  ANYANWU, MD, FACOG Attending Obstetrician & Gynecologist Faculty Practice, [facility]

## 2017-11-12 NOTE — Lactation Note (Signed)
This note was copied from a baby's chart. Lactation Consultation Note  Patient Name: Jamie Wyn ForsterJessica Rubinoff GNFAO'ZToday's Date: 11/12/2017 Reason for consult: Initial assessment;Early term 37-38.6wks;Other (Comment)(code apgar at birth) Pecola LeisureBaby was initially in the NICU for respiratory distress.  He received 3 formula feedings while there.  Mom went for a BTL this AM.  She states he has latched on today easily.  Instructed to latch baby with feeding cues.  Encouraged to call for assist prn.  Breastfeeding consultation services and support information given to patient.  Maternal Data    Feeding Feeding Type: Breast Fed Length of feed: (12 mls)  LATCH Score                   Interventions    Lactation Tools Discussed/Used     Consult Status Consult Status: Follow-up Date: 11/13/17 Follow-up type: In-patient    Huston FoleyMOULDEN, Maryana Pittmon S 11/12/2017, 1:55 PM

## 2017-11-13 MED ORDER — OXYCODONE-ACETAMINOPHEN 5-325 MG PO TABS: 1.0000 | ORAL_TABLET | ORAL | 0 refills | Status: DC | PRN

## 2017-11-13 MED ORDER — IBUPROFEN 600 MG PO TABS: 600.0000 mg | ORAL_TABLET | Freq: Four times a day (QID) | ORAL | 0 refills | Status: DC

## 2017-11-13 NOTE — Discharge Summary (Addendum)
OB Discharge Summary    Patient Name: Jamie Riggs DOB: 1986-02-05 MRN: [id number] Date of admission: 11/11/2017  Delivering MD: Pincus Large )  Date of discharge: 11/13/2017  Admitting diagnosis: SROM Intrauterine pregnancy: [redacted]w[redacted]d    Secondary diagnosis:  Active Problems:   Patient Active Problem List   Diagnosis Date Noted  . SVD (spontaneous vaginal delivery) 11/12/2017  . Indication for care in labor or delivery 11/11/2017  . Unwanted fertility 10/17/2017  . GBS bacteriuria 10/01/2017  . Blurred vision, left eye 09/30/2017  . Maternal iron deficiency anemia affecting pregnancy in third trimester, antepartum 08/26/2017  . Pica in adults 08/10/2017  . H/O postpartum hemorrhage, currently pregnant 06/15/2017  . 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 Low hemoglobin A2 level in blood 05/17/2017  . Supervision of high risk pregnancy, antepartum 05/05/2017  . History of pre-eclampsia in prior pregnancy, currently pregnant in first trimester 05/05/2017  . History of gestational diabetes 05/05/2017   Additional problems:  Red cell Ab positive Anemia s/p iron transfusion     Discharge diagnosis: Term Pregnancy Delivered                                                                                                Post partum procedures:postpartum tubal ligation  Complications: None  Hospital course:  Onset of Labor With Vaginal Delivery     32 y.o. yo [id number] at [redacted]w[redacted]d was admitted in Latent Labor on 11/11/2017. Patient had an uncomplicated labor course as follows:  Membrane Rupture Time/Date: 3:00 PM ,11/11/2017   Intrapartum Procedures: Episiotomy: None [1]                                         Lacerations:  None [1]  Patient had a delivery of a Viable infant. 11/12/2017  Information for the patient's newborn:  Callie, Facey [[id number]]  Delivery Method: Vaginal, Spontaneous(Filed from Delivery Summary)    Pateint  had an uncomplicated postpartum course with BTL performed on PPD#0 without complications.  She is ambulating, tolerating a regular diet, passing flatus, and urinating well. Patient is discharged home in stable condition on 11/13/17.   Physical exam  Vitals:   11/13/17 0131 11/13/17 0534  BP: 121/64 131/60  Pulse: 61 62  Resp: 18 16  Temp: 98.2 F (36.8 C) 98 F (36.7 C)  SpO2: 96% 97%    General: alert, cooperative and no distress CV: regular rate and rhythm Lochia: appropriate Uterine Fundus: firm Incision: Dressing is clean, dry, and intact DVT Evaluation: No evidence of DVT seen on physical exam.  Labs: No results found for this or any previous visit (from the past 24 hour(s)).  Discharge instruction: per After Visit Summary and "Baby and Me Booklet".  After visit meds:  No Known Allergies  Allergies as of 11/13/2017   No Known Allergies     Medication List    STOP taking these medications   aspirin 81 MG chewable tablet   cyclobenzaprine 10  MG tablet Commonly known as:  FLEXERIL   metroNIDAZOLE 500 MG tablet Commonly known as:  FLAGYL     TAKE these medications   docusate sodium 50 MG capsule Commonly known as:  COLACE Take 50 mg by mouth daily.   FUSION PLUS Caps Take 1 capsule by mouth daily before breakfast.   ibuprofen 600 MG tablet Commonly known as:  ADVIL,MOTRIN Take 1 tablet (600 mg total) by mouth every 6 (six) hours.   iron polysaccharides 150 MG capsule Commonly known as:  NIFEREX Take 1 capsule (150 mg total) by mouth 2 (two) times daily.   oxyCODONE-acetaminophen 5-325 MG tablet Commonly known as:  PERCOCET Take 1-2 tablets by mouth every 4 (four) hours as needed for severe pain.   VITAFOL FE+ 90-1-200 & 50 MG Cppk Take 2 tablets by mouth daily before breakfast. What changed:  how much to take       Diet: routine diet  Activity: Advance as tolerated. Pelvic rest for 6 weeks.   Outpatient follow up: 1 week skin incision  check Future Appointments:  Future Appointments  Date Time Provider Department Center  11/14/2017  8:15 AM [facility] Cornwallenney, Rachelle A, CNM [facility] None  11/21/2017  8:00 AM [facility] US 3 [facility]    Follow up Appt: No Follow-up on file.  Postpartum contraception: Tubal Ligation  Newborn Data: APGAR (1 MIN): 5   APGAR (5 MINS): 8    Baby Feeding: Breast Disposition:home with mother  Ellwood Denselison Rumball, DO  11/13/2017   I assessed this pt and agree with the above assessment

## 2017-11-13 NOTE — Discharge Instructions (Signed)
Vaginal Delivery, Care After °Refer to this sheet in the next few weeks. These instructions provide you with information about caring for yourself after vaginal delivery. Your health care provider may also give you more specific instructions. Your treatment has been planned according to current medical practices, but problems sometimes occur. Call your health care provider if you have any problems or questions. °What can I expect after the procedure? °After vaginal delivery, it is common to have: °· Some bleeding from your vagina. °· Soreness in your abdomen, your vagina, and the area of skin between your vaginal opening and your anus (perineum). °· Pelvic cramps. °· Fatigue. ° °Follow these instructions at home: °Medicines °· Take over-the-counter and prescription medicines only as told by your health care provider. °· If you were prescribed an antibiotic medicine, take it as told by your health care provider. Do not stop taking the antibiotic until it is finished. °Driving ° °· Do not drive or operate heavy machinery while taking prescription pain medicine. °· Do not drive for 24 hours if you received a sedative. °Lifestyle °· Do not drink alcohol. This is especially important if you are breastfeeding or taking medicine to relieve pain. °· Do not use tobacco products, including cigarettes, chewing tobacco, or e-cigarettes. If you need help quitting, ask your health care provider. °Eating and drinking °· Drink at least 8 eight-ounce glasses of water every day unless you are told not to by your health care provider. If you choose to breastfeed your baby, you may need to drink more water than this. °· Eat high-fiber foods every day. These foods may help prevent or relieve constipation. High-fiber foods include: °? Whole grain cereals and breads. °? Brown rice. °? Beans. °? Fresh fruits and vegetables. °Activity °· Return to your normal activities as told by your health care provider. Ask your health care provider  what activities are safe for you. °· Rest as much as possible. Try to rest or take a nap when your baby is sleeping. °· Do not lift anything that is heavier than your baby or 10 lb (4.5 kg) until your health care provider says that it is safe. °· Talk with your health care provider about when you can engage in sexual activity. This may depend on your: °? Risk of infection. °? Rate of healing. °? Comfort and desire to engage in sexual activity. °Vaginal Care °· If you have an episiotomy or a vaginal tear, check the area every day for signs of infection. Check for: °? More redness, swelling, or pain. °? More fluid or blood. °? Warmth. °? Pus or a bad smell. °· Do not use tampons or douches until your health care provider says this is safe. °· Watch for any blood clots that may pass from your vagina. These may look like clumps of dark red, brown, or black discharge. °General instructions °· Keep your perineum clean and dry as told by your health care provider. °· Wear loose, comfortable clothing. °· Wipe from front to back when you use the toilet. °· Ask your health care provider if you can shower or take a bath. If you had an episiotomy or a perineal tear during labor and delivery, your health care provider may tell you not to take baths for a certain length of time. °· Wear a bra that supports your breasts and fits you well. °· If possible, have someone help you with household activities and help care for your baby for at least a few days after   you leave the hospital. °· Keep all follow-up visits for you and your baby as told by your health care provider. This is important. °Contact a health care provider if: °· You have: °? Vaginal discharge that has a bad smell. °? Difficulty urinating. °? Pain when urinating. °? A sudden increase or decrease in the frequency of your bowel movements. °? More redness, swelling, or pain around your episiotomy or vaginal tear. °? More fluid or blood coming from your episiotomy or  vaginal tear. °? Pus or a bad smell coming from your episiotomy or vaginal tear. °? A fever. °? A rash. °? Little or no interest in activities you used to enjoy. °? Questions about caring for yourself or your baby. °· Your episiotomy or vaginal tear feels warm to the touch. °· Your episiotomy or vaginal tear is separating or does not appear to be healing. °· Your breasts are painful, hard, or turn red. °· You feel unusually sad or worried. °· You feel nauseous or you vomit. °· You pass large blood clots from your vagina. If you pass a blood clot from your vagina, save it to show to your health care provider. Do not flush blood clots down the toilet without having your health care provider look at them. °· You urinate more than usual. °· You are dizzy or light-headed. °· You have not breastfed at all and you have not had a menstrual period for 12 weeks after delivery. °· You have stopped breastfeeding and you have not had a menstrual period for 12 weeks after you stopped breastfeeding. °Get help right away if: °· You have: °? Pain that does not go away or does not get better with medicine. °? Chest pain. °? Difficulty breathing. °? Blurred vision or spots in your vision. °? Thoughts about hurting yourself or your baby. °· You develop pain in your abdomen or in one of your legs. °· You develop a severe headache. °· You faint. °· You bleed from your vagina so much that you fill two sanitary pads in one hour. °This information is not intended to replace advice given to you by your health care provider. Make sure you discuss any questions you have with your health care provider. °Document Released: 09/03/2000 Document Revised: 02/18/2016 Document Reviewed: 09/21/2015 °Elsevier Interactive Patient Education © 2018 [employer] ° ° °Postpartum Tubal Ligation, Care After °Refer to this sheet in the next few weeks. These instructions provide you with information about caring for yourself after your procedure. Your health  care provider may also give you more specific instructions. Your treatment has been planned according to current medical practices, but problems sometimes occur. Call your health care provider if you have any problems or questions after your procedure. °What can I expect after the procedure? °After the procedure, it is common to have: °· A sore throat. °· Bruising or pain in your back. °· Nausea or vomiting. °· Dizziness. °· Mild abdominal discomfort or pain, such as cramping, gas pain, or feeling bloated. °· Soreness where the incision was made. °· Tiredness. °· Pain in your shoulders. ° °Follow these instructions at home: °Medicines °· Take over-the-counter and prescription medicines only as told by your health care provider. °· Do not take aspirin because it can cause bleeding. °· Do not drive or operate heavy machinery while taking prescription pain medicine. °Activity °· Rest for the rest of the day. °· Gradually return to your normal activities over the next few days. °· Do not have sex, douche,   or put a tampon or anything else in your vagina for 6 weeks or as long as told by your health care provider. °· Do not lift anything that is heavier than your baby for 2 weeks or as long as told by your health care provider. °Incision care °· Follow instructions from your health care provider about how to take care of your incision. Make sure you: °? Wash your hands with soap and water before you change your bandage (dressing). If soap and water are not available, use hand sanitizer. °? Change your dressing as told by your health care provider. °? Leave stitches (sutures) in place. They may need to stay in place for 2 weeks or longer. °· Check your incision area every day for signs of infection. Check for: °? More redness, swelling, or pain. °? More fluid or blood. °? Warmth. °? Pus or a bad smell. °Other Instructions °· Do not take baths, swim, or use a hot tub until your health care provider approves. You may take  showers. °· Keep all follow-up visits as told by your health care provider. This is important. °Contact a health care provider if: °· You have more redness, swelling, or pain around your incision. °· Your incision feels warm to the touch. °· You have pus or a bad smell coming from your incision. °· The edges of your incision break open after the sutures have been removed. °· Your pain does not improve after 2-3 days. °· You have a rash. °· You repeatedly become dizzy or lightheaded. °· Your pain medicine is not helping. °· You are constipated. °Get help right away if: °· You have a fever. °· You faint. °· You have pain in your abdomen that gets worse. °· You have fluid or blood coming from your sutures. °· You have shortness of breath or difficulty breathing. °· You have chest pain or leg pain. °· You have ongoing nausea or diarrhea. °This information is not intended to replace advice given to you by your health care provider. Make sure you discuss any questions you have with your health care provider. °Document Released: 03/07/2012 Document Revised: 02/09/2016 Document Reviewed: 08/17/2015 °Elsevier Interactive Patient Education © 2018 [employer] ° °

## 2017-11-14 ENCOUNTER — Encounter (HOSPITAL_COMMUNITY): Payer: Self-pay

## 2017-11-14 ENCOUNTER — Encounter: Payer: Medicaid Other | Admitting: Certified Nurse Midwife

## 2017-11-14 LAB — TYPE AND SCREEN
ABO/RH(D): A POS
Antibody Screen: POSITIVE
DONOR AG TYPE: NEGATIVE
Donor AG Type: NEGATIVE
Donor AG Type: NEGATIVE
Donor AG Type: NEGATIVE
UNIT DIVISION: 0
UNIT DIVISION: 0
Unit division: 0
Unit division: 0

## 2017-11-14 LAB — BPAM RBC
BLOOD PRODUCT EXPIRATION DATE: 201903152359
BLOOD PRODUCT EXPIRATION DATE: 201903162359
Blood Product Expiration Date: 201903162359
Blood Product Expiration Date: 201903232359
UNIT TYPE AND RH: 600
Unit Type and Rh: 600
Unit Type and Rh: 600
Unit Type and Rh: 600

## 2017-11-15 ENCOUNTER — Other Ambulatory Visit: Payer: Self-pay | Admitting: Certified Nurse Midwife

## 2017-11-21 ENCOUNTER — Ambulatory Visit (HOSPITAL_COMMUNITY): Payer: Medicaid Other

## 2017-11-25 ENCOUNTER — Ambulatory Visit: Payer: Medicaid Other | Admitting: Certified Nurse Midwife

## 2017-11-25 ENCOUNTER — Encounter: Payer: Self-pay | Admitting: Certified Nurse Midwife

## 2017-11-25 VITALS — BP 127/84 | HR 73 | Temp 98.1°F | Wt 200.0 lb

## 2017-11-25 DIAGNOSIS — T8149XA Infection following a procedure, other surgical site, initial encounter: Secondary | ICD-10-CM

## 2017-11-25 MED ORDER — IBUPROFEN 800 MG PO TABS: 800.0000 mg | ORAL_TABLET | Freq: Three times a day (TID) | ORAL | 1 refills | Status: DC | PRN

## 2017-11-25 MED ORDER — AMOXICILLIN-POT CLAVULANATE 875-125 MG PO TABS: 1.0000 | ORAL_TABLET | Freq: Two times a day (BID) | ORAL | 0 refills | Status: DC

## 2017-11-25 MED ORDER — ACETAMINOPHEN-CODEINE #3 300-30 MG PO TABS
1.0000 | ORAL_TABLET | ORAL | 0 refills | Status: DC | PRN
Start: 2017-11-25 — End: 2020-05-20

## 2017-11-25 MED ORDER — MUPIROCIN 2 % EX OINT: 1.0000 "application " | TOPICAL_OINTMENT | Freq: Two times a day (BID) | CUTANEOUS | 99 refills | Status: DC | PRN

## 2017-11-25 NOTE — Progress Notes (Signed)
Pt presents for incision check today.

## 2017-11-25 NOTE — Progress Notes (Addendum)
Patient ID: Jamie Riggs, female   DOB: 1986-03-28, 32 y.o.   MRN: [id number]  Chief Complaint  Patient presents with  . Wound Check    HPI Jamie Riggs is a 32 y.o. female.  Here for wound check at umbilicus s/p BTL on 11/12/17.  Had normal discharge home from the hospital after the procedure on  11/13/17.  Home health nurse came to her home a few days ago and stated that her wound looked abnormal and was told to schedule an appointment at the office to have it evaluated.  States some tenderness on left side. Denies fever. Reports constipation and is taking Colace OTC. Able to do ADLs without assistance. Has 4 children with her today.    HPI  Past Medical History:  Diagnosis Date  . Anemia   . Ectopic pregnancy 2017    Past Surgical History:  Procedure Laterality Date  . TUBAL LIGATION Bilateral 11/12/2017   Procedure: POST PARTUM TUBAL LIGATION;  Surgeon: Tereso Newcomer, MD;  Location: [facility];  Service: Gynecology;  Laterality: Bilateral;  . WISDOM TOOTH EXTRACTION  2018    Family History  Problem Relation Age of Onset  . Diabetes Mother   . Hypertension Father   . Diabetes Maternal Grandmother   . Cancer Paternal Grandmother     Social History Social History   Tobacco Use  . Smoking status: Former Games developer  . Smokeless tobacco: Never Used  Substance Use Topics  . Alcohol use: No  . Drug use: No    No Known Allergies  Current Outpatient Medications  Medication Sig Dispense Refill  . docusate sodium (COLACE) 50 MG capsule Take 50 mg by mouth daily.    . Iron-FA-B Cmp-C-Biot-Probiotic (FUSION PLUS) CAPS Take 1 capsule by mouth daily before breakfast. 30 capsule 5  . Prenat-FePoly-Metf-FA-DHA-DSS (VITAFOL FE+) 90-1-200 & 50 MG CPPK Take 2 tablets by mouth daily before breakfast. (Patient taking differently: Take 1 tablet by mouth daily before breakfast. ) 60 each 11  . acetaminophen-codeine (TYLENOL #3) 300-30 MG tablet Take 1-2 tablets by mouth every  4 (four) hours as needed for moderate pain. 30 tablet 0  . amoxicillin-clavulanate (AUGMENTIN) 875-125 MG tablet Take 1 tablet by mouth 2 (two) times daily. 14 tablet 0  . ibuprofen (ADVIL,MOTRIN) 800 MG tablet Take 1 tablet (800 mg total) by mouth every 8 (eight) hours as needed. 60 tablet 1  . iron polysaccharides (NIFEREX) 150 MG capsule Take 1 capsule (150 mg total) by mouth 2 (two) times daily. (Patient not taking: Reported on 11/25/2017) 60 capsule 3  . mupirocin ointment (BACTROBAN) 2 % Apply 1 application topically 2 (two) times daily as needed. 30 g PRN  . oxyCODONE-acetaminophen (PERCOCET) 5-325 MG tablet Take 1-2 tablets by mouth every 4 (four) hours as needed for severe pain. (Patient not taking: Reported on 11/25/2017) 30 tablet 0   No current facility-administered medications for this visit.     Review of Systems Review of Systems Constitutional: negative for fatigue and weight loss Respiratory: negative for cough and wheezing Cardiovascular: negative for chest pain, fatigue and palpitations Gastrointestinal: negative for abdominal pain and change in bowel habits Genitourinary:negative Integument/breast: negative for nipple discharge Musculoskeletal:negative for myalgias Neurological: negative for gait problems and tremors Behavioral/Psych: negative for abusive relationship, depression Endocrine: negative for temperature intolerance      Blood pressure 127/84, pulse 73, temperature 98.1 F (36.7 C), temperature source Oral, weight 200 lb (90.7 kg), last menstrual period 02/22/2017, unknown if currently breastfeeding.  Physical  Exam Physical Exam General:   alert  Skin:   no rash or abnormalities  Lungs:   clear to auscultation bilaterally  Heart:   regular rate and rhythm, S1, S2 normal, no murmur, click, rub or gallop  Breasts:   deferred  Abdomen:  normal findings: no organomegaly, soft, non-tender and no hernia Umbilicus: some redness on lower umbilicus X 2 1/2 inch  areas, no heat, slight tenderness on left side, non draining, no abscess.    Pelvis:  Deferred    75% of 15 min visit spent on counseling and coordination of care.   Data Reviewed Previous medical hx, meds, labs  Assessment     Most likely wound cellulitis: small, acute does not require systemic IV antibiotics    Plan   Trial of PO antibiotics and umbilical ointments.  No dressing required.   Meds ordered this encounter  Medications  . amoxicillin-clavulanate (AUGMENTIN) 875-125 MG tablet    Sig: Take 1 tablet by mouth 2 (two) times daily.    Dispense:  14 tablet    Refill:  0  . mupirocin ointment (BACTROBAN) 2 %    Sig: Apply 1 application topically 2 (two) times daily as needed.    Dispense:  30 g    Refill:  PRN  . ibuprofen (ADVIL,MOTRIN) 800 MG tablet    Sig: Take 1 tablet (800 mg total) by mouth every 8 (eight) hours as needed.    Dispense:  60 tablet    Refill:  1  . acetaminophen-codeine (TYLENOL #3) 300-30 MG tablet    Sig: Take 1-2 tablets by mouth every 4 (four) hours as needed for moderate pain.    Dispense:  30 tablet    Refill:  0   Possible management options: CT scan if not resolving and lab work.  Follow up in 4 weeks for Postpartum exam with Pap smear.

## 2017-11-28 ENCOUNTER — Other Ambulatory Visit: Payer: Self-pay | Admitting: Certified Nurse Midwife

## 2017-12-12 ENCOUNTER — Other Ambulatory Visit (HOSPITAL_COMMUNITY)
Admission: RE | Admit: 2017-12-12 | Discharge: 2017-12-12 | Disposition: A | Discharge status: Home / Self Care | Payer: Medicaid Other | Source: Ambulatory Visit | Attending: Certified Nurse Midwife | Admitting: Certified Nurse Midwife

## 2017-12-12 ENCOUNTER — Ambulatory Visit (INDEPENDENT_AMBULATORY_CARE_PROVIDER_SITE_OTHER): Payer: Medicaid Other | Admitting: Advanced Practice Midwife

## 2017-12-12 ENCOUNTER — Encounter: Payer: Self-pay | Admitting: Advanced Practice Midwife

## 2017-12-12 ENCOUNTER — Other Ambulatory Visit: Payer: Self-pay

## 2017-12-12 VITALS — BP 123/75 | HR 60 | Ht 61.0 in | Wt 201.2 lb

## 2017-12-12 DIAGNOSIS — Z3009 Encounter for other general counseling and advice on contraception: Secondary | ICD-10-CM

## 2017-12-12 DIAGNOSIS — Z124 Encounter for screening for malignant neoplasm of cervix: Secondary | ICD-10-CM

## 2017-12-12 DIAGNOSIS — Z9851 Tubal ligation status: Secondary | ICD-10-CM

## 2017-12-12 NOTE — Patient Instructions (Signed)
Health Maintenance, Female Adopting a healthy lifestyle and getting preventive care can go a long way to promote health and wellness. Talk with your health care provider about what schedule of regular examinations is right for you. This is a good chance for you to check in with your provider about disease prevention and staying healthy. In between checkups, there are plenty of things you can do on your own. Experts have done a lot of research about which lifestyle changes and preventive measures are most likely to keep you healthy. Ask your health care provider for more information. Weight and diet Eat a healthy diet  Be sure to include plenty of vegetables, fruits, low-fat dairy products, and lean protein.  Do not eat a lot of foods high in solid fats, added sugars, or salt.  Get regular exercise. This is one of the most important things you can do for your health. ? Most adults should exercise for at least 150 minutes each week. The exercise should increase your heart rate and make you sweat (moderate-intensity exercise). ? Most adults should also do strengthening exercises at least twice a week. This is in addition to the moderate-intensity exercise.  Maintain a healthy weight  Body mass index (BMI) is a measurement that can be used to identify possible weight problems. It estimates body fat based on height and weight. Your health care provider can help determine your BMI and help you achieve or maintain a healthy weight.  For females 20 years of age and older: ? A BMI below 18.5 is considered underweight. ? A BMI of 18.5 to 24.9 is normal. ? A BMI of 25 to 29.9 is considered overweight. ? A BMI of 30 and above is considered obese.  Watch levels of cholesterol and blood lipids  You should start having your blood tested for lipids and cholesterol at 31 years of age, then have this test every 5 years.  You may need to have your cholesterol levels checked more often if: ? Your lipid or  cholesterol levels are high. ? You are older than 31 years of age. ? You are at high risk for heart disease.  Cancer screening Lung Cancer  Lung cancer screening is recommended for adults 55-80 years old who are at high risk for lung cancer because of a history of smoking.  A yearly low-dose CT scan of the lungs is recommended for people who: ? Currently smoke. ? Have quit within the past 15 years. ? Have at least a 30-pack-year history of smoking. A pack year is smoking an average of one pack of cigarettes a day for 1 year.  Yearly screening should continue until it has been 15 years since you quit.  Yearly screening should stop if you develop a health problem that would prevent you from having lung cancer treatment.  Breast Cancer  Practice breast self-awareness. This means understanding how your breasts normally appear and feel.  It also means doing regular breast self-exams. Let your health care provider know about any changes, no matter how small.  If you are in your 20s or 30s, you should have a clinical breast exam (CBE) by a health care provider every 1-3 years as part of a regular health exam.  If you are 40 or older, have a CBE every year. Also consider having a breast X-ray (mammogram) every year.  If you have a family history of breast cancer, talk to your health care provider about genetic screening.  If you are at high risk   for breast cancer, talk to your health care provider about having an MRI and a mammogram every year.  Breast cancer gene (BRCA) assessment is recommended for women who have family members with BRCA-related cancers. BRCA-related cancers include: ? Breast. ? Ovarian. ? Tubal. ? Peritoneal cancers.  Results of the assessment will determine the need for genetic counseling and BRCA1 and BRCA2 testing.  Cervical Cancer Your health care provider may recommend that you be screened regularly for cancer of the pelvic organs (ovaries, uterus, and  vagina). This screening involves a pelvic examination, including checking for microscopic changes to the surface of your cervix (Pap test). You may be encouraged to have this screening done every 3 years, beginning at age 22.  For women ages 56-65, health care providers may recommend pelvic exams and Pap testing every 3 years, or they may recommend the Pap and pelvic exam, combined with testing for human papilloma virus (HPV), every 5 years. Some types of HPV increase your risk of cervical cancer. Testing for HPV may also be done on women of any age with unclear Pap test results.  Other health care providers may not recommend any screening for nonpregnant women who are considered low risk for pelvic cancer and who do not have symptoms. Ask your health care provider if a screening pelvic exam is right for you.  If you have had past treatment for cervical cancer or a condition that could lead to cancer, you need Pap tests and screening for cancer for at least 20 years after your treatment. If Pap tests have been discontinued, your risk factors (such as having a new sexual partner) need to be reassessed to determine if screening should resume. Some women have medical problems that increase the chance of getting cervical cancer. In these cases, your health care provider may recommend more frequent screening and Pap tests.  Colorectal Cancer  This type of cancer can be detected and often prevented.  Routine colorectal cancer screening usually begins at 31 years of age and continues through 31 years of age.  Your health care provider may recommend screening at an earlier age if you have risk factors for colon cancer.  Your health care provider may also recommend using home test kits to check for hidden blood in the stool.  A small camera at the end of a tube can be used to examine your colon directly (sigmoidoscopy or colonoscopy). This is done to check for the earliest forms of colorectal  cancer.  Routine screening usually begins at age 33.  Direct examination of the colon should be repeated every 5-10 years through 31 years of age. However, you may need to be screened more often if early forms of precancerous polyps or small growths are found.  Skin Cancer  Check your skin from head to toe regularly.  Tell your health care provider about any new moles or changes in moles, especially if there is a change in a mole's shape or color.  Also tell your health care provider if you have a mole that is larger than the size of a pencil eraser.  Always use sunscreen. Apply sunscreen liberally and repeatedly throughout the day.  Protect yourself by wearing long sleeves, pants, a wide-brimmed hat, and sunglasses whenever you are outside.  Heart disease, diabetes, and high blood pressure  High blood pressure causes heart disease and increases the risk of stroke. High blood pressure is more likely to develop in: ? People who have blood pressure in the high end of  the normal range (130-139/85-89 mm Hg). ? People who are overweight or obese. ? People who are African American.  If you are 21-29 years of age, have your blood pressure checked every 3-5 years. If you are 3 years of age or older, have your blood pressure checked every year. You should have your blood pressure measured twice-once when you are at a hospital or clinic, and once when you are not at a hospital or clinic. Record the average of the two measurements. To check your blood pressure when you are not at a hospital or clinic, you can use: ? An automated blood pressure machine at a pharmacy. ? A home blood pressure monitor.  If you are between 17 years and 37 years old, ask your health care provider if you should take aspirin to prevent strokes.  Have regular diabetes screenings. This involves taking a blood sample to check your fasting blood sugar level. ? If you are at a normal weight and have a low risk for diabetes,  have this test once every three years after 31 years of age. ? If you are overweight and have a high risk for diabetes, consider being tested at a younger age or more often. Preventing infection Hepatitis B  If you have a higher risk for hepatitis B, you should be screened for this virus. You are considered at high risk for hepatitis B if: ? You were born in a country where hepatitis B is common. Ask your health care provider which countries are considered high risk. ? Your parents were born in a high-risk country, and you have not been immunized against hepatitis B (hepatitis B vaccine). ? You have HIV or AIDS. ? You use needles to inject street drugs. ? You live with someone who has hepatitis B. ? You have had sex with someone who has hepatitis B. ? You get hemodialysis treatment. ? You take certain medicines for conditions, including cancer, organ transplantation, and autoimmune conditions.  Hepatitis C  Blood testing is recommended for: ? Everyone born from [birth year] through 1964. ? Anyone with known risk factors for hepatitis C.  Sexually transmitted infections (STIs)  You should be screened for sexually transmitted infections (STIs) including gonorrhea and chlamydia if: ? You are sexually active and are younger than 31 years of age. ? You are older than 31 years of age and your health care provider tells you that you are at risk for this type of infection. ? Your sexual activity has changed since you were last screened and you are at an increased risk for chlamydia or gonorrhea. Ask your health care provider if you are at risk.  If you do not have HIV, but are at risk, it may be recommended that you take a prescription medicine daily to prevent HIV infection. This is called pre-exposure prophylaxis (PrEP). You are considered at risk if: ? You are sexually active and do not regularly use condoms or know the HIV status of your partner(s). ? You take drugs by injection. ? You are  sexually active with a partner who has HIV.  Talk with your health care provider about whether you are at high risk of being infected with HIV. If you choose to begin PrEP, you should first be tested for HIV. You should then be tested every 3 months for as long as you are taking PrEP. Pregnancy  If you are premenopausal and you may become pregnant, ask your health care provider about preconception counseling.  If you may become  pregnant, take 400 to 800 micrograms (mcg) of folic acid every day.  If you want to prevent pregnancy, talk to your health care provider about birth control (contraception). Osteoporosis and menopause  Osteoporosis is a disease in which the bones lose minerals and strength with aging. This can result in serious bone fractures. Your risk for osteoporosis can be identified using a bone density scan.  If you are 45 years of age or older, or if you are at risk for osteoporosis and fractures, ask your health care provider if you should be screened.  Ask your health care provider whether you should take a calcium or vitamin D supplement to lower your risk for osteoporosis.  Menopause may have certain physical symptoms and risks.  Hormone replacement therapy may reduce some of these symptoms and risks. Talk to your health care provider about whether hormone replacement therapy is right for you. Follow these instructions at home:  Schedule regular health, dental, and eye exams.  Stay current with your immunizations.  Do not use any tobacco products including cigarettes, chewing tobacco, or electronic cigarettes.  If you are pregnant, do not drink alcohol.  If you are breastfeeding, limit how much and how often you drink alcohol.  Limit alcohol intake to no more than 1 drink per day for nonpregnant women. One drink equals 12 ounces of beer, 5 ounces of wine, or 1 ounces of hard liquor.  Do not use street drugs.  Do not share needles.  Ask your health care  provider for help if you need support or information about quitting drugs.  Tell your health care provider if you often feel depressed.  Tell your health care provider if you have ever been abused or do not feel safe at home. This information is not intended to replace advice given to you by your health care provider. Make sure you discuss any questions you have with your health care provider. Document Released: 03/22/2011 Document Revised: 02/12/2016 Document Reviewed: 06/10/2015 Elsevier Interactive Patient Education  2018 [place]. Postpartum Tubal Ligation, Care After Refer to this sheet in the next few weeks. These instructions provide you with information about caring for yourself after your procedure. Your health care provider may also give you more specific instructions. Your treatment has been planned according to current medical practices, but problems sometimes occur. Call your health care provider if you have any problems or questions after your procedure. What can I expect after the procedure? After the procedure, it is common to have:  A sore throat.  Bruising or pain in your back.  Nausea or vomiting.  Dizziness.  Mild abdominal discomfort or pain, such as cramping, gas pain, or feeling bloated.  Soreness where the incision was made.  Tiredness.  Pain in your shoulders.  Follow these instructions at home: Medicines  Take over-the-counter and prescription medicines only as told by your health care provider.  Do not take aspirin because it can cause bleeding.  Do not drive or operate heavy machinery while taking prescription pain medicine. Activity  Rest for the rest of the day.  Gradually return to your normal activities over the next few days.  Do not have sex, douche, or put a tampon or anything else in your vagina for 6 weeks or as long as told by your health care provider.  Do not lift anything that is heavier than your baby for 2 weeks or as long  as told by your health care provider. Incision care  Follow instructions from your  health care provider about how to take care of your incision. Make sure you: ? Wash your hands with soap and water before you change your bandage (dressing). If soap and water are not available, use hand sanitizer. ? Change your dressing as told by your health care provider. ? Leave stitches (sutures) in place. They may need to stay in place for 2 weeks or longer.  Check your incision area every day for signs of infection. Check for: ? More redness, swelling, or pain. ? More fluid or blood. ? Warmth. ? Pus or a bad smell. Other Instructions  Do not take baths, swim, or use a hot tub until your health care provider approves. You may take showers.  Keep all follow-up visits as told by your health care provider. This is important. Contact a health care provider if:  You have more redness, swelling, or pain around your incision.  Your incision feels warm to the touch.  You have pus or a bad smell coming from your incision.  The edges of your incision break open after the sutures have been removed.  Your pain does not improve after 2-3 days.  You have a rash.  You repeatedly become dizzy or lightheaded.  Your pain medicine is not helping.  You are constipated. Get help right away if:  You have a fever.  You faint.  You have pain in your abdomen that gets worse.  You have fluid or blood coming from your sutures.  You have shortness of breath or difficulty breathing.  You have chest pain or leg pain.  You have ongoing nausea or diarrhea. This information is not intended to replace advice given to you by your health care provider. Make sure you discuss any questions you have with your health care provider. Document Released: 03/07/2012 Document Revised: 02/09/2016 Document Reviewed: 08/17/2015 Elsevier Interactive Patient Education  [employer].

## 2017-12-12 NOTE — Progress Notes (Signed)
Subjective:     Jamie ForsterJessica Riggs is a 32 y.o. female who presents for a postpartum visit. She is 4 weeks postpartum following a spontaneous vaginal delivery. I have fully reviewed the prenatal and intrapartum course. The delivery was at 37.4 gestational weeks. Outcome: spontaneous vaginal delivery. Anesthesia: epidural. Postpartum course has been Unremarkable. Baby's course has been Unremarkable. Baby is feeding by both breast and bottle - Gerber Gentle. Bleeding staining only. Bowel function is Constipated. Bladder function is normal. Patient is sexually active. Contraception method is tubal ligation. Postpartum depression screening: negative.  Has done well since delivery.  Spots off and on.  Breastfeeding well but supplements with formula BTL incision is improving.  The following portions of the patient's history were reviewed and updated as appropriate: allergies, current medications, past family history, past medical history, past social history, past surgical history and problem list.  Review of Systems Pertinent items are noted in HPI.   Objective:    BP 123/75   Pulse 60   Ht 5\' 1"  (1.549 m)   Wt 201 lb 3.2 oz (91.3 kg)   Breastfeeding? Yes   BMI 38.02 kg/m   General:  alert, cooperative and no distress   Breasts:  inspection negative, no nipple discharge or bleeding, no masses or nodularity palpable  Lungs: clear to auscultation bilaterally  Heart:  normal rate  Abdomen: soft, non-tender; bowel sounds normal; no masses,  no organomegaly   Umbilical incision healed, tiny area of granulation tissue on left   Vulva:  normal  Vagina: normal vagina, no discharge, exudate, lesion, or erythema  Cervix:  multiparous appearance and very difficult to visualize  Corpus: normal size, contour, position, consistency, mobility, non-tender  Adnexa:  not evaluated  Rectal Exam: Not performed.        Assessment:     Normal 4 week  postpartum exam. Pap smear done at today's visit.   Plan:     1. Contraception: tubal ligation 2. Pap sent 3. Follow up in: 1 year or as needed.

## 2017-12-12 NOTE — Progress Notes (Signed)
  Subjective:     Jamie Riggs is a 32 y.o. female who presents for a postpartum visit. She is   postpartum following a . I have fully reviewed the prenatal and intrapartum course. The delivery was at *** gestational weeks. Outcome: . Anesthesia: . Postpartum course has been ***. Baby's course has been ***. Baby is feeding by . Bleeding . Bowel function is . Bladder function is . Patient  sexually active. Contraception method is . Postpartum depression screening: .    Review of Systems    Objective:    BP 123/75   Pulse 60   Ht 5\' 1"  (1.549 m)   Wt 201 lb 3.2 oz (91.3 kg)   Breastfeeding? Yes   BMI 38.02 kg/m   General:     Breasts:    Lungs:   Heart:    Abdomen:    Vulva:    Vagina:   Cervix:    Corpus:   Adnexa:    Rectal Exam:         Assessment:    *** postpartum exam. Pap smear  at today's visit.   Plan:    1. Contraception:  2. *** 3. Follow up in:   or as needed.

## 2017-12-16 LAB — CYTOLOGY - PAP
DIAGNOSIS: NEGATIVE
HPV (WINDOPATH): NOT DETECTED

## 2018-07-18 ENCOUNTER — Encounter (HOSPITAL_COMMUNITY): Payer: Self-pay | Admitting: Emergency Medicine

## 2018-07-18 ENCOUNTER — Emergency Department (HOSPITAL_COMMUNITY)
Admission: EM | Admit: 2018-07-18 | Discharge: 2018-07-18 | Disposition: A | Discharge status: Home / Self Care | Payer: Medicaid Other | Attending: Emergency Medicine | Admitting: Emergency Medicine

## 2018-07-18 DIAGNOSIS — Z87891 Personal history of nicotine dependence: Secondary | ICD-10-CM | POA: Insufficient documentation

## 2018-07-18 DIAGNOSIS — R531 Weakness: Secondary | ICD-10-CM | POA: Insufficient documentation

## 2018-07-18 DIAGNOSIS — R200 Anesthesia of skin: Secondary | ICD-10-CM | POA: Insufficient documentation

## 2018-07-18 LAB — I-STAT CHEM 8, ED
BUN: 9 mg/dL (ref 6–20)
CALCIUM ION: 1.15 mmol/L (ref 1.15–1.40)
Chloride: 105 mmol/L (ref 98–111)
Creatinine, Ser: 0.6 mg/dL (ref 0.44–1.00)
GLUCOSE: 116 mg/dL — AB (ref 70–99)
HCT: 35 % — ABNORMAL LOW (ref 36.0–46.0)
Hemoglobin: 11.9 g/dL — ABNORMAL LOW (ref 12.0–15.0)
Potassium: 3.5 mmol/L (ref 3.5–5.1)
Sodium: 141 mmol/L (ref 135–145)
TCO2: 28 mmol/L (ref 22–32)

## 2018-07-18 NOTE — ED Notes (Signed)
Using clean technique, removed a 20g IV from patients right AC. Catheter intact after removal. Placed a 2x2 guaze and tape over the site.

## 2018-07-18 NOTE — Discharge Instructions (Addendum)
Follow-up with her primary care doctor if the symptoms persist, return for severe pain, fever, weakness

## 2018-07-18 NOTE — ED Provider Notes (Signed)
[facility]-EMERGENCY DEPT Provider Note   CSN: [id number] Arrival date & time: 07/18/18  1050     History   Chief Complaint Chief Complaint  Patient presents with  . Numbness  . Extremity Weakness    HPI Jamie Riggs is a 32 y.o. female.  HPI Pt has been having weakness in her right leg.  The sx started a few days ago.  She feels like she is having some numbness as well from the knee down in a stocking pattern. No pain.  No urinary frequency.   Past Medical History:  Diagnosis Date  . Anemia   . Ectopic pregnancy 2017    Patient Active Problem List   Diagnosis Date Noted  . Unwanted fertility 10/17/2017  . Blurred vision, left eye 09/30/2017  . Maternal iron deficiency anemia affecting pregnancy in third trimester, antepartum 08/26/2017  . Pica in adults 08/10/2017  . Low hemoglobin A2 level in blood 05/17/2017    Past Surgical History:  Procedure Laterality Date  . TUBAL LIGATION Bilateral 11/12/2017   Procedure: POST PARTUM TUBAL LIGATION;  Surgeon: Tereso Newcomer, MD;  Location: [facility];  Service: Gynecology;  Laterality: Bilateral;  . WISDOM TOOTH EXTRACTION  2018     OB History    Gravida  6   Para  4   Term  4   Preterm      AB  2   Living  4     SAB  1   TAB      Ectopic  1   Multiple      Live Births  4            Home Medications    Prior to Admission medications   Medication Sig Start Date End Date Taking? Authorizing Provider  acetaminophen-codeine (TYLENOL #3) 300-30 MG tablet Take 1-2 tablets by mouth every 4 (four) hours as needed for moderate pain. Patient not taking: Reported on 07/18/2018 11/25/17   Orvilla Cornwall A, CNM  amoxicillin-clavulanate (AUGMENTIN) 875-125 MG tablet Take 1 tablet by mouth 2 (two) times daily. Patient not taking: Reported on 07/18/2018 11/25/17   Orvilla Cornwall A, CNM  ibuprofen (ADVIL,MOTRIN) 800 MG tablet Take 1 tablet (800 mg total) by mouth every 8  (eight) hours as needed. Patient not taking: Reported on 07/18/2018 11/25/17   Orvilla Cornwall A, CNM  iron polysaccharides (NIFEREX) 150 MG capsule Take 1 capsule (150 mg total) by mouth 2 (two) times daily. Patient not taking: Reported on 11/25/2017 10/12/17   Johney Maine, MD  Iron-FA-B Cmp-C-Biot-Probiotic (FUSION PLUS) CAPS Take 1 capsule by mouth daily before breakfast. Patient not taking: Reported on 07/18/2018 09/16/17   Brock Bad, MD  mupirocin ointment (BACTROBAN) 2 % Apply 1 application topically 2 (two) times daily as needed. Patient not taking: Reported on 07/18/2018 11/25/17   Roe Coombs, CNM  oxyCODONE-acetaminophen (PERCOCET) 5-325 MG tablet Take 1-2 tablets by mouth every 4 (four) hours as needed for severe pain. Patient not taking: Reported on 11/25/2017 11/13/17   Ellwood Dense, DO  Prenat-FePoly-Metf-FA-DHA-DSS (VITAFOL FE+) 90-1-200 & 50 MG CPPK Take 2 tablets by mouth daily before breakfast. Patient not taking: Reported on 07/18/2018 09/16/17   Brock Bad, MD    Family History Family History  Problem Relation Age of Onset  . Diabetes Mother   . Hypertension Father   . Diabetes Maternal Grandmother   . Cancer Paternal Grandmother     Social History Social History  Tobacco Use  . Smoking status: Former Games developer  . Smokeless tobacco: Never Used  Substance Use Topics  . Alcohol use: No  . Drug use: No     Allergies   Patient has no known allergies.   Review of Systems Review of Systems  All other systems reviewed and are negative.    Physical Exam Updated Vital Signs BP 128/73 (BP Location: Left Arm)   Pulse (!) 57   Temp 98 F (36.7 C) (Oral)   Resp 19   SpO2 100%   Physical Exam  Constitutional: She appears well-developed and well-nourished. No distress.  HENT:  Head: Normocephalic and atraumatic.  Right Ear: External ear normal.  Left Ear: External ear normal.  Eyes: Conjunctivae are normal. Right eye exhibits no  discharge. Left eye exhibits no discharge. No scleral icterus.  Neck: Neck supple. No tracheal deviation present.  Cardiovascular: Normal rate, regular rhythm and intact distal pulses.  Pulmonary/Chest: Effort normal and breath sounds normal. No stridor. No respiratory distress. She has no wheezes. She has no rales.  Abdominal: Soft. Bowel sounds are normal. She exhibits no distension. There is no tenderness. There is no rebound and no guarding.  Musculoskeletal: She exhibits no edema or tenderness.  Neurological: She is alert. She has normal strength. No cranial nerve deficit (no facial droop, extraocular movements intact, no slurred speech) or sensory deficit. She exhibits normal muscle tone. She displays no seizure activity. Coordination normal.  5/5 plantar and dorsiflexion, 5/5 quad strength, sensation intact bilateral lower extremity  Skin: Skin is warm and dry. No rash noted.  Psychiatric: She has a normal mood and affect.  Nursing note and vitals reviewed.    ED Treatments / Results  Labs (all labs ordered are listed, but only abnormal results are displayed) Labs Reviewed  I-STAT CHEM 8, ED - Abnormal; Notable for the following components:      Result Value   Glucose, Bld 116 (*)    Hemoglobin 11.9 (*)    HCT 35.0 (*)    All other components within normal limits     Procedures Procedures (including critical care time)  Medications Ordered in ED Medications - No data to display   Initial Impression / Assessment and Plan / ED Course  I have reviewed the triage vital signs and the nursing notes.  Pertinent labs & imaging results that were available during my care of the patient were reviewed by me and considered in my medical decision making (see chart for details).   Patient presents with complaints of numbness in her right leg.  Patient has a normal neurologic exam in the ED.  She has normal strength and sensation.  She is not having any back pain to suggest some type of  lumbar radiculopathy.  Her symptoms are slightly atypical and that it is a stocking type pattern.  Laboratory testing that showed no acute abnormalities.  Discussed the findings with the patient.  Recommend she continue to monitor.  If her symptoms do not resolve in the next few days she should follow-up with a primary care doctor to discuss further evaluation.  She is to return to the emergency room starts having fever, severe pain or weakness  Final Clinical Impressions(s) / ED Diagnoses   Final diagnoses:  Numbness    ED Discharge Orders    None       Linwood Dibbles, MD 07/18/18 1308

## 2018-07-18 NOTE — ED Triage Notes (Signed)
Patient here from home with complaints of bilateral leg pain that started 4 days ago. Ambulatory, but reports tingling and numbness. Denies loss of bladder and bowel control.

## 2018-11-18 IMAGING — US US OB COMP LESS 14 WK
1 series · 15 of 28 positions shown · non-contrast
Comparison: None.

CLINICAL DATA: Vaginal bleeding, viability

EXAM:
OBSTETRIC <14 WK US AND TRANSVAGINAL OB US
TECHNIQUE: Both transabdominal and transvaginal ultrasound examinations were
performed for complete evaluation of the gestation as well as the
maternal uterus, adnexal regions, and pelvic cul-de-sac.
Transvaginal technique was performed to assess early pregnancy.

[Series 1: us ob comp less 14 wk · 64 acquisitions, 15 frames shown]
[im 1/64]
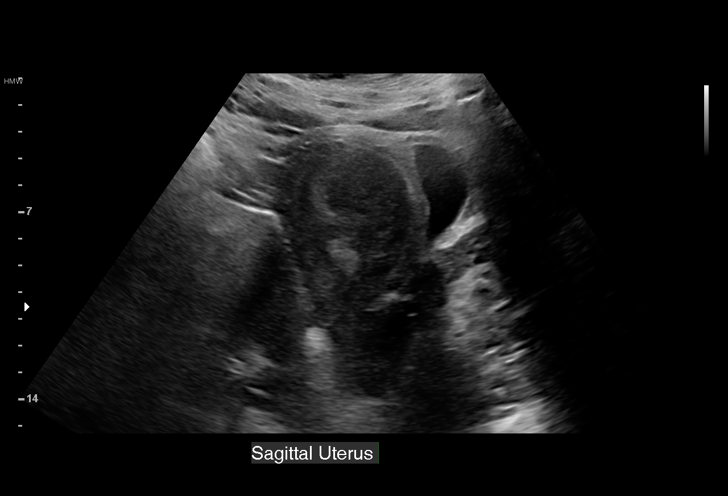
[im 5/64]
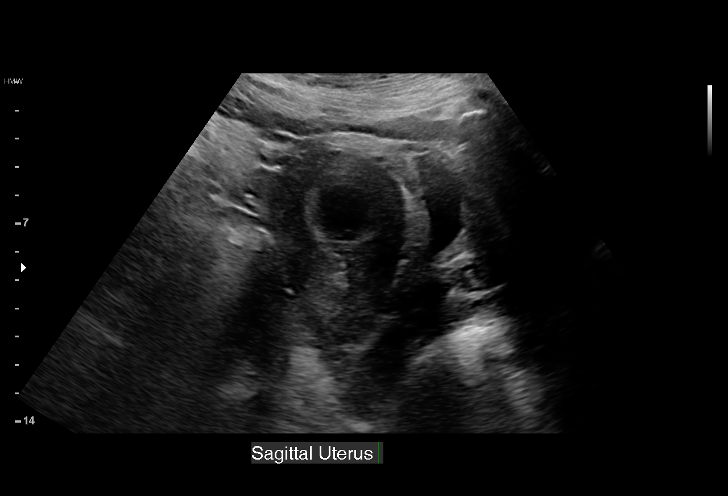
[im 10/64]
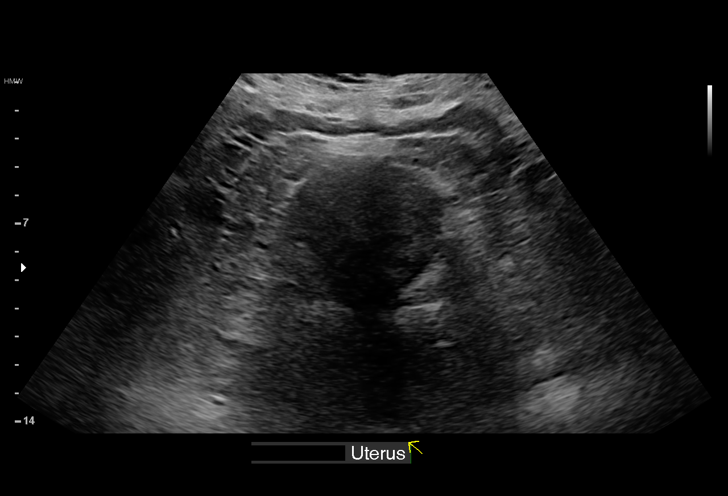
[im 15/64]
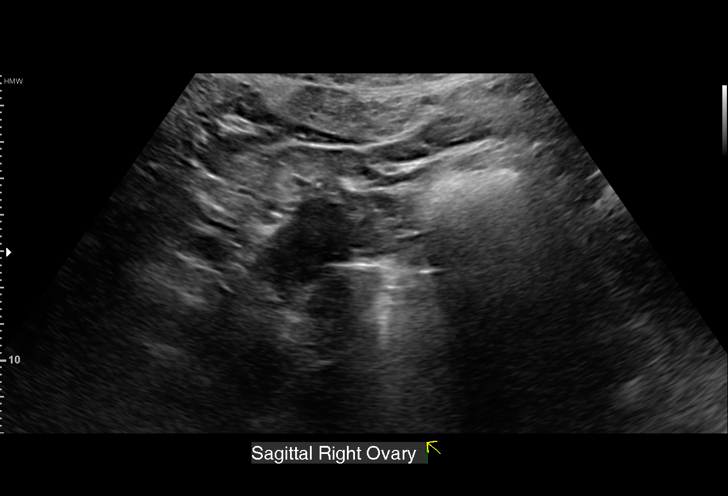
[im 19/64]
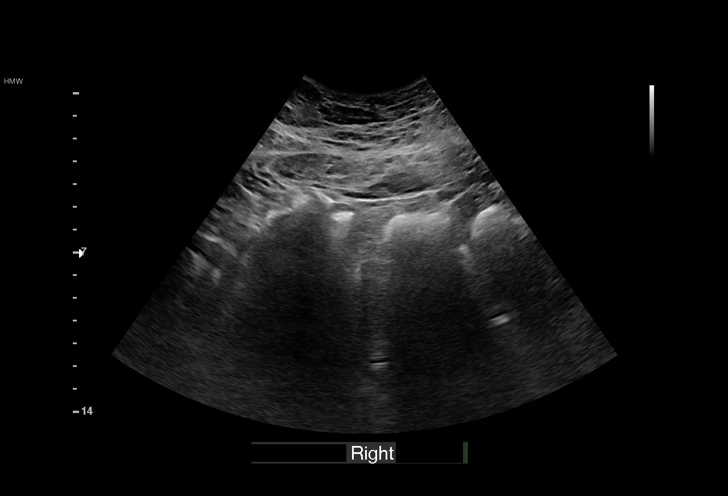
[im 24/64]
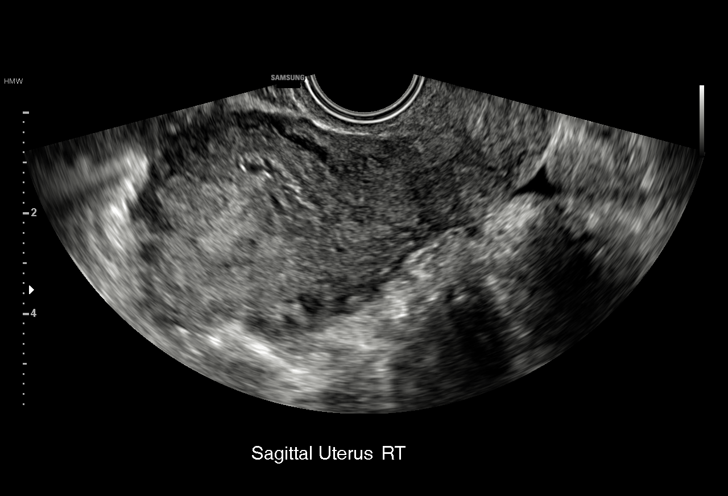
[im 29/64]
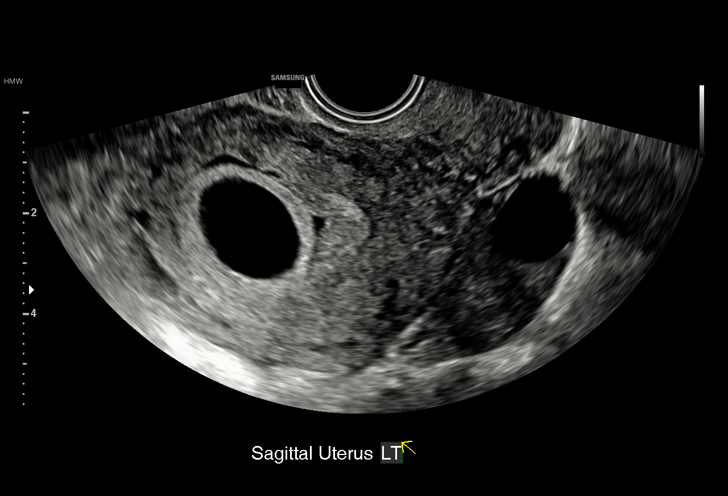
[im 33/64]
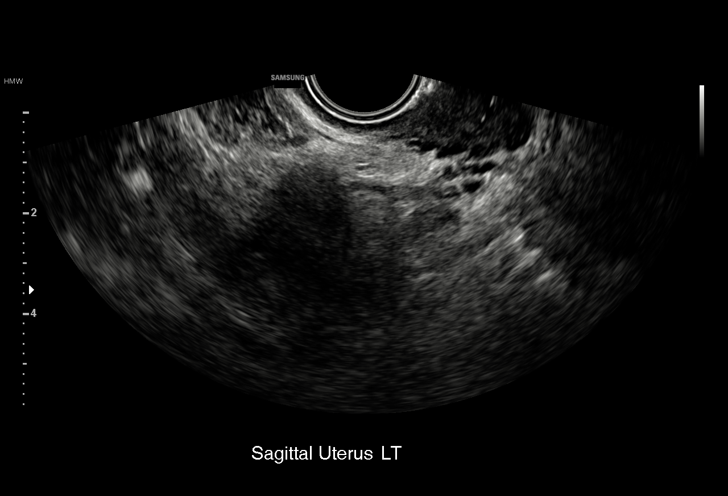
[im 36/64]
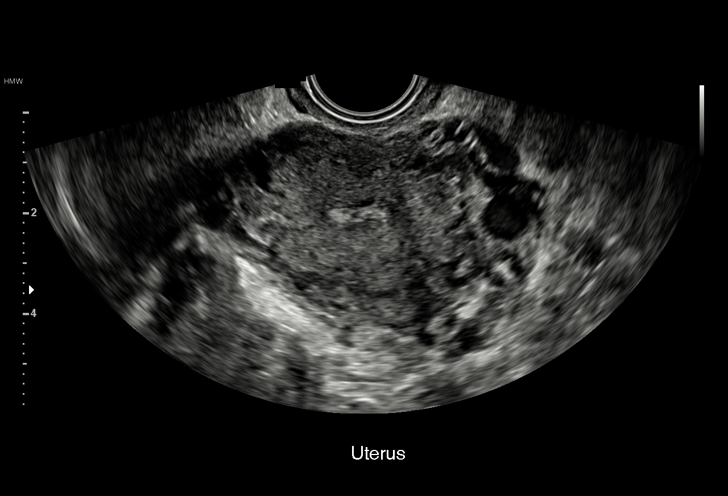
[im 40/64]
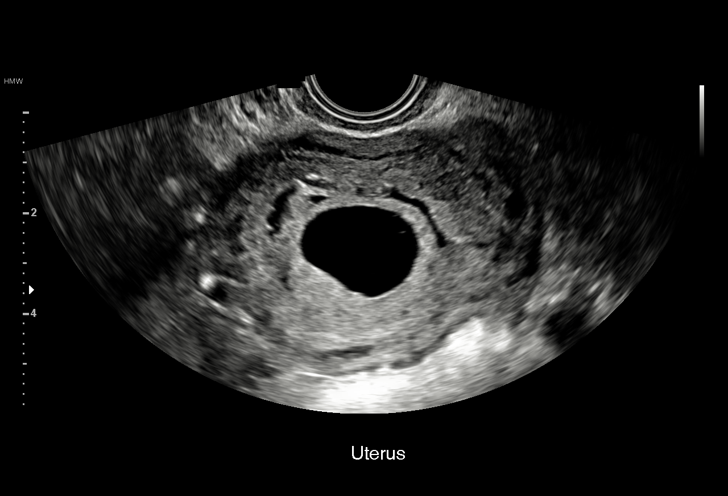
[im 45/64]
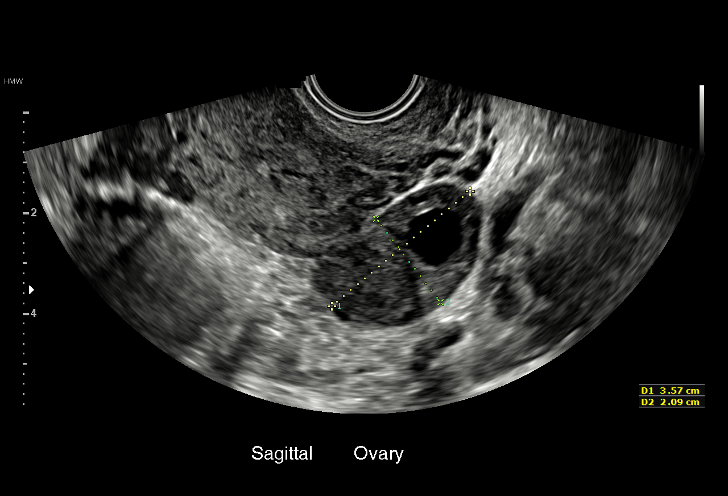
[im 50/64]
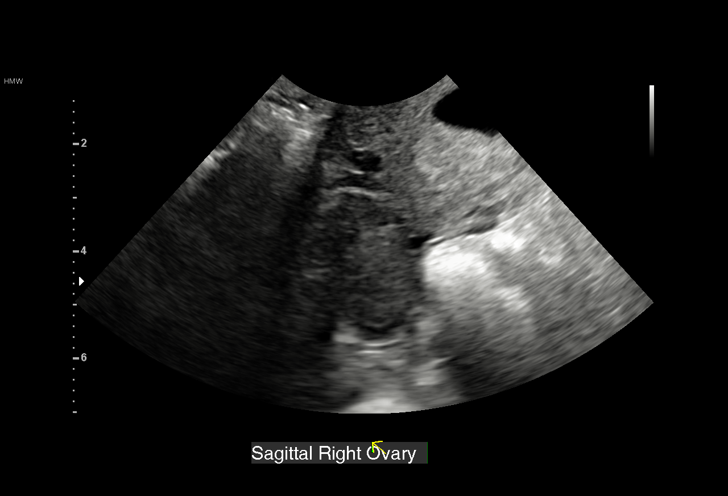
[im 54/64]
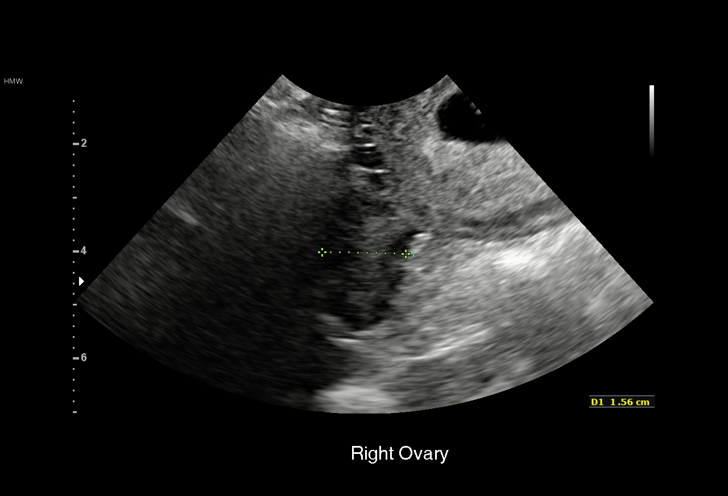
[im 59/64]
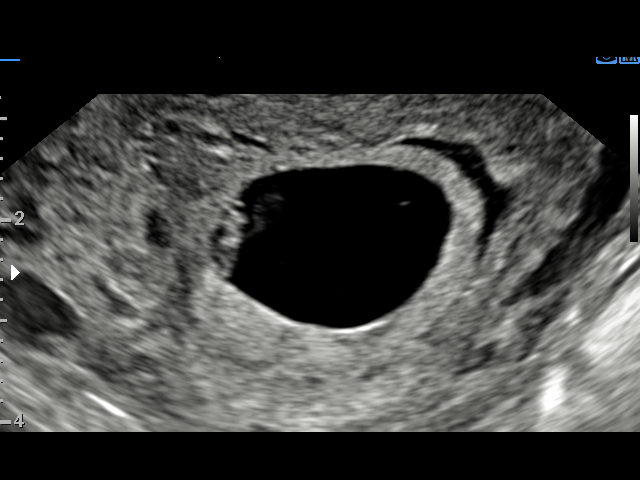
[im 64/64]
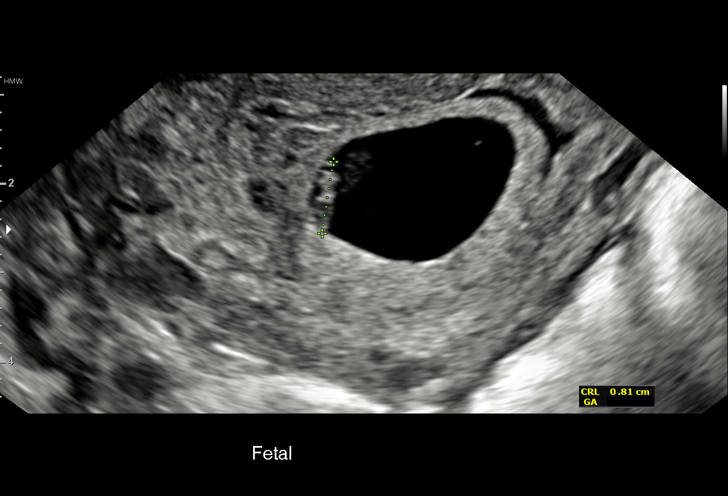

[15 of 28 positions shown; findings below may reference images not displayed]

FINDINGS: Intrauterine gestational sac: Single

Yolk sac:  Visualized

Embryo:  Visualized

Cardiac Activity: Visualized

Heart Rate: 129  bpm

MSD:   mm    w     d

CRL:  7.7  mm   6 w   4 d                  US EDC: 12/04/2017

Subchorionic hemorrhage:  Small subchorionic hemorrhage

Maternal uterus/adnexae: No adnexal masses or abnormal free fluid.
IMPRESSION: Six week 4 day intrauterine pregnancy. Fetal heart rate 129 beats
per minute. Small subchorionic hemorrhage.

## 2018-12-31 IMAGING — US US MFM FETAL NUCHAL TRANSLUCENCY
1 series · 15 of 28 positions shown · non-contrast
Comparison: none

[Series 1: us mfm fetal nuchal translucency · 15 of 32 slices shown]
[im 1/32]
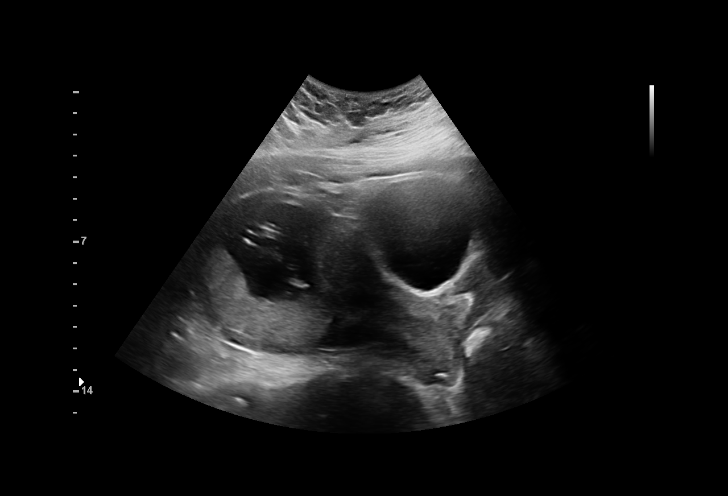
[im 3/32]
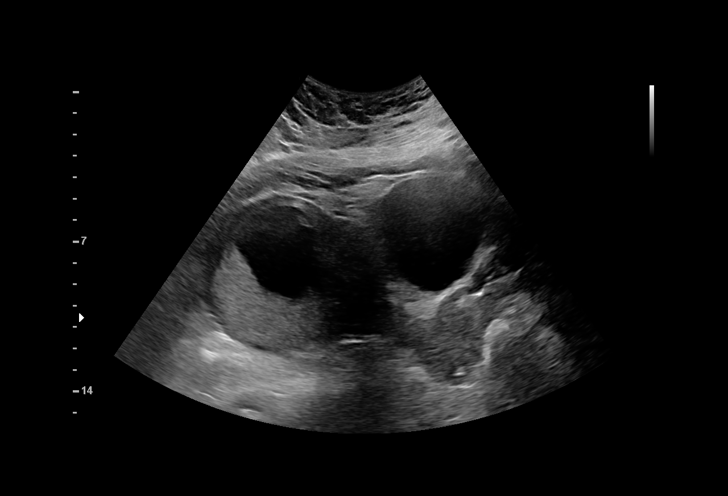
[im 5/32]
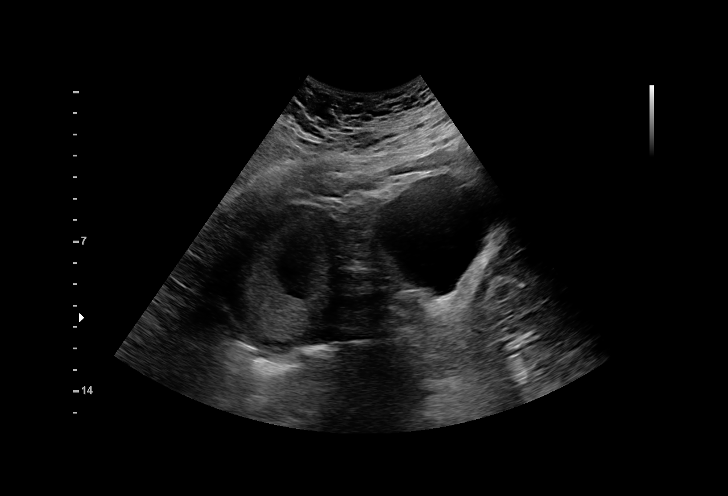
[im 7/32]
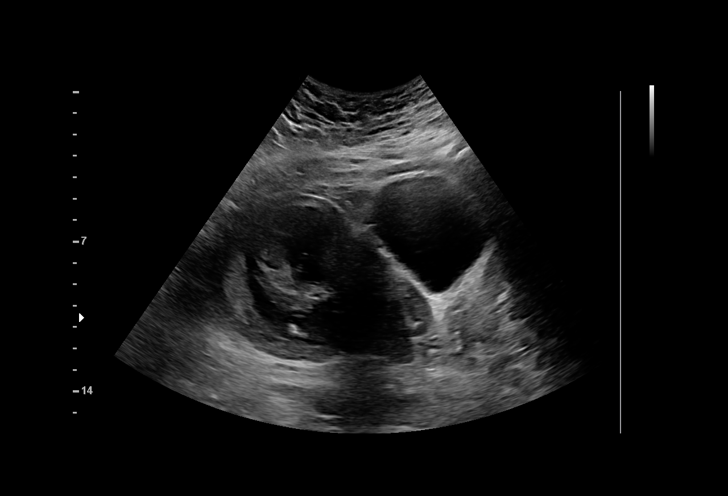
[im 10/32]
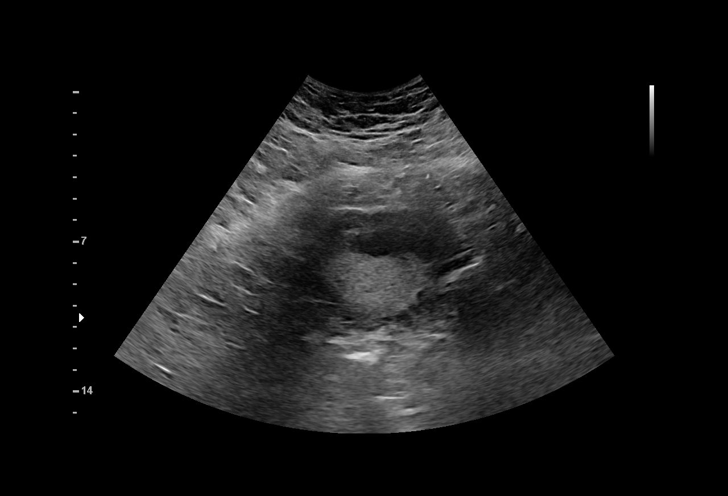
[im 12/32]
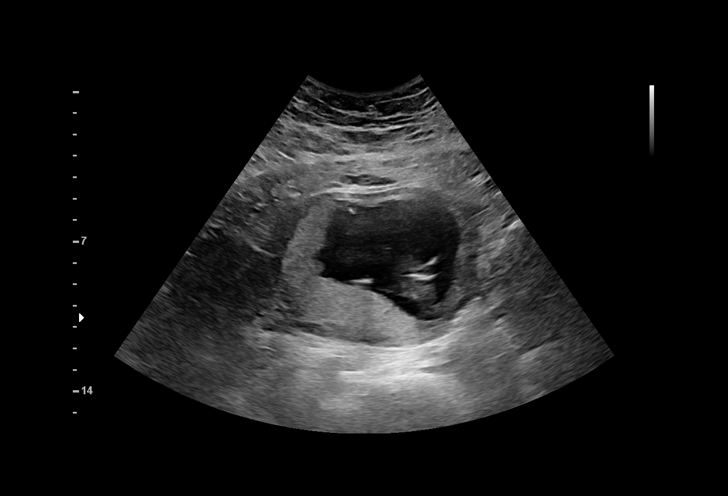
[im 14/32]
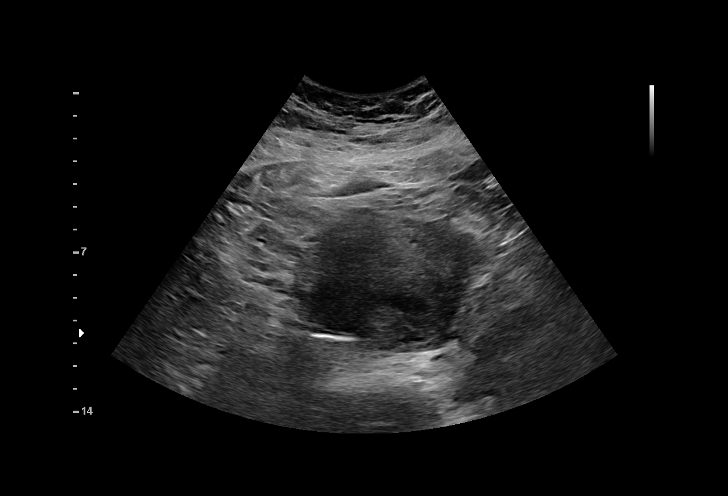
[im 17/32]
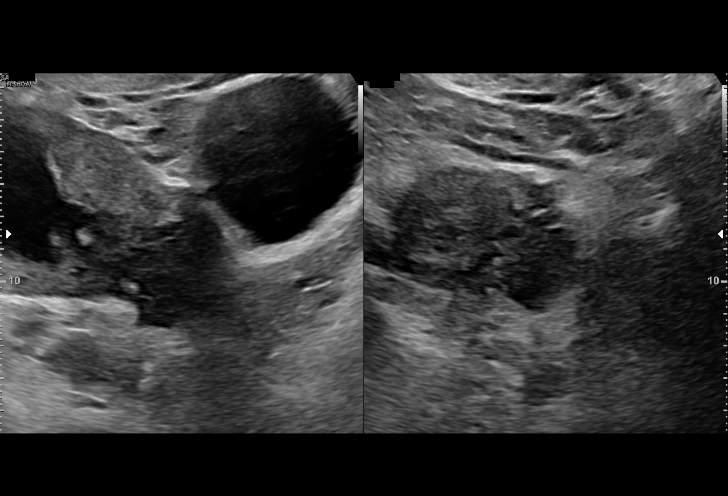
[im 18/32]
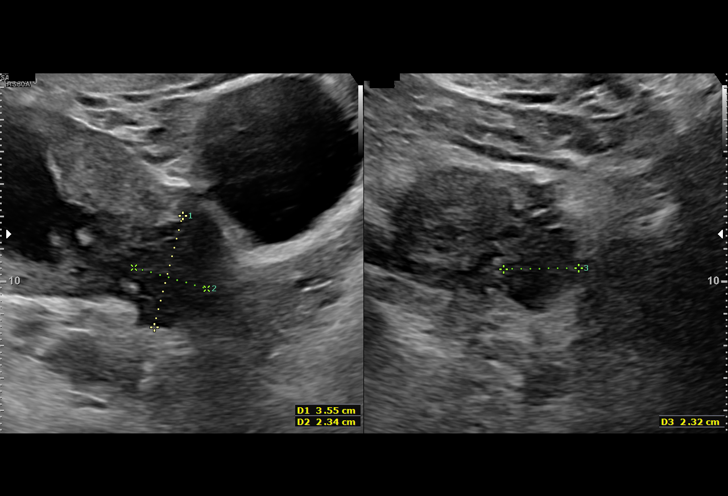
[im 20/32]
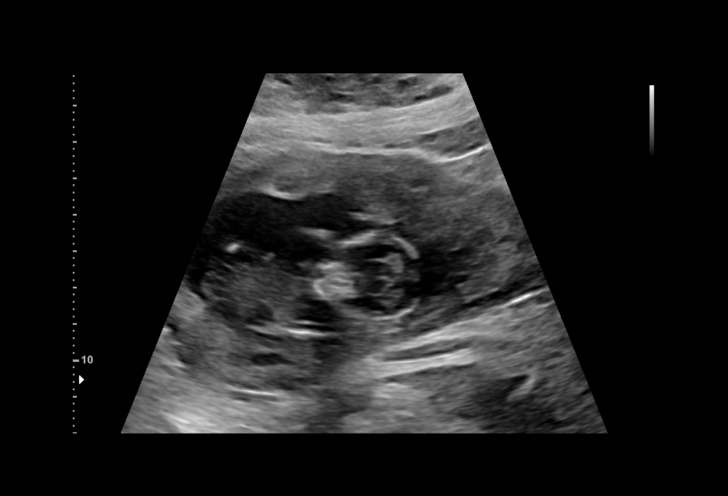
[im 22/32]
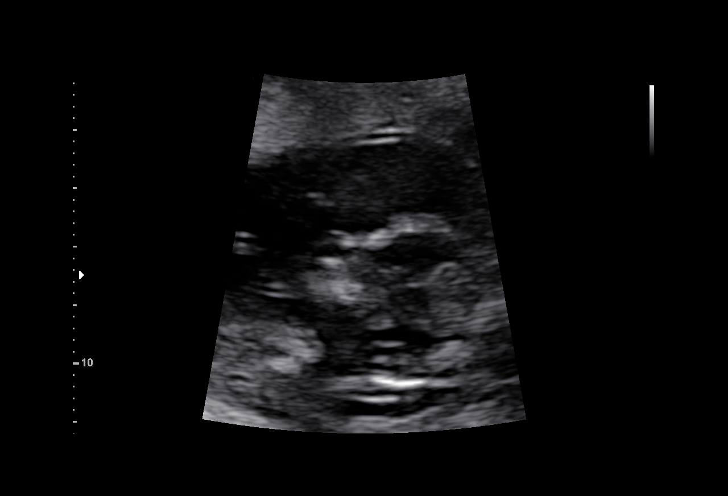
[im 25/32]
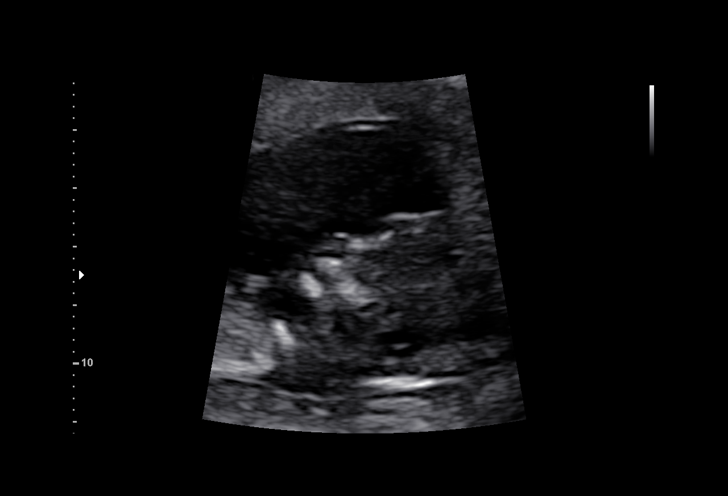
[im 27/32]
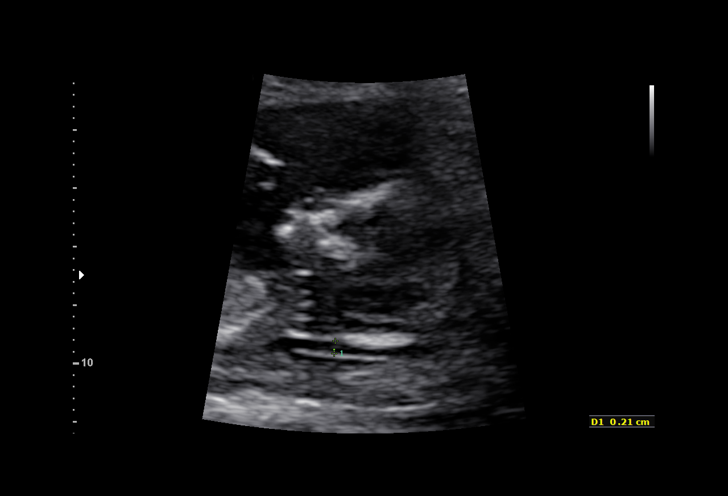
[im 29/32]
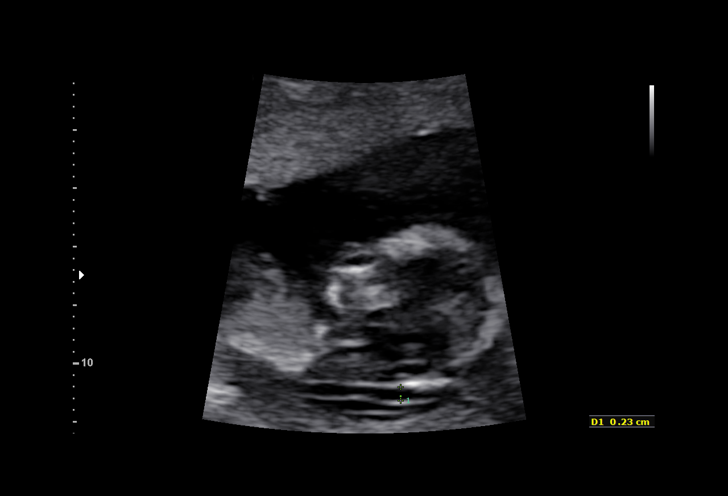
[im 32/32]
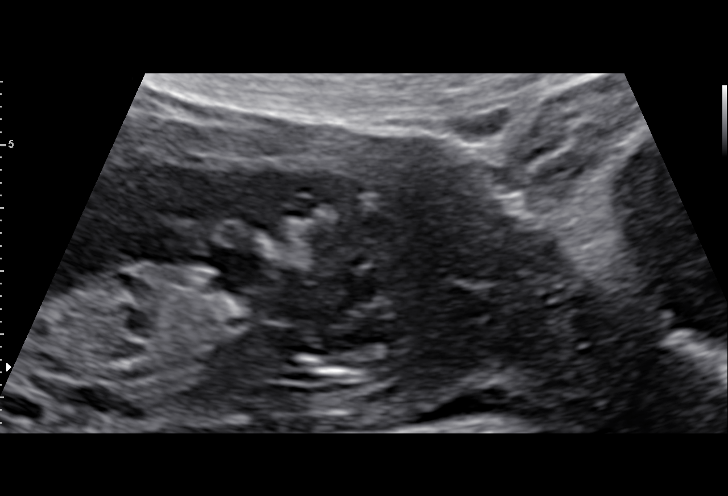

[15 of 28 positions shown; findings below may reference images not displayed]

OB/Gyn Clinic

TRANSLUCENCY

Indications

13 weeks gestation of pregnancy
Encounter for nuchal translucency
Obesity complicating pregnancy, first
trimester
Poor obstetric history: Previous gestational
diabetes/PIH
Maternal care for other isoimmunization, first
trimester, unspecified (Anti S)
OB History

Gravidity:    6         Term:   3        Prem:   0        SAB:   1
TOP:          0       Ectopic:  1        Living: 3
Fetal Evaluation

Num Of Fetuses:     1
Preg. Location:     Intrauterine
Gest. Sac:          Intrauterine
Fetal Pole:         Visualized
Fetal Heart         153
Rate(bpm):
Cardiac Activity:   Observed
Biometry
CRL:      68.3  mm     G. Age:  13w 0d                  EDD:   12/02/17
Gestational Age

LMP:           13w 3d        Date:  02/22/17                 EDD:   11/29/17
Best:          13w 3d     Det. By:  LMP  (02/22/17)          EDD:   11/29/17
1st Trimester Genetic Sonogram Screening

CRL:            68.3  mm    G. Age:   13w 0d                 EDD:   12/02/17
Nuc Trans:       2.1  mm

Nasal Bone:                 Present
Cervix Uterus Adnexa

Cervix
Closed

Uterus
No abnormality visualized.

Left Ovary
Within normal limits.

Right Ovary
Within normal limits.

Cul De Sac:   No free fluid seen.

Adnexa:       No abnormality visualized.
Impression

IUP at 13+3 weeks, with isoimmunization. Here for first
trimester screening
Normal fetal morphology
Normal fetal cardiac activity
Nasal bone is present
CRL confirms menstrual dating
Nuchal translucency measures 2.1 mm
Recommendations

Serum analytes will be drawn today.
See MFM consult for details

## 2019-02-08 IMAGING — US US MFM OB DETAIL+14 WK
1 series · 14 of 28 positions shown · non-contrast
Comparison: none

[Series 1: us mfm ob detail+14 wk · 97 acquisitions, 14 frames shown]
[im 4/97]
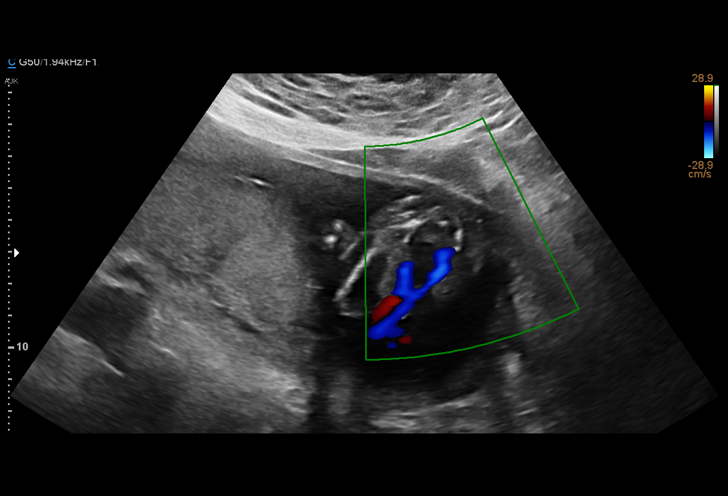
[im 11/97]
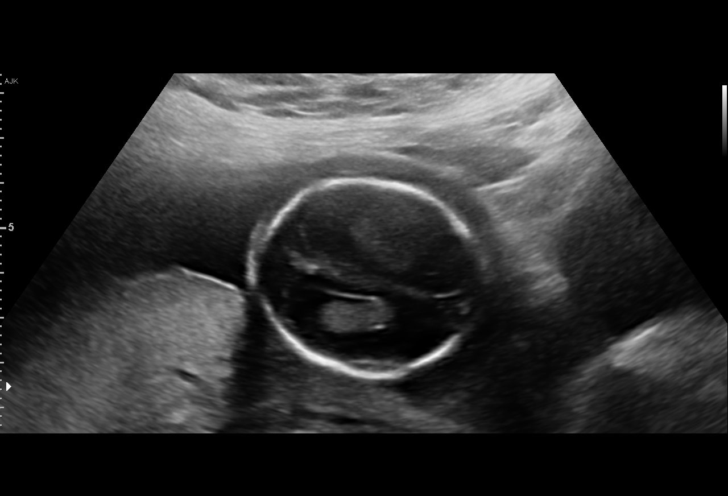
[im 18/97]
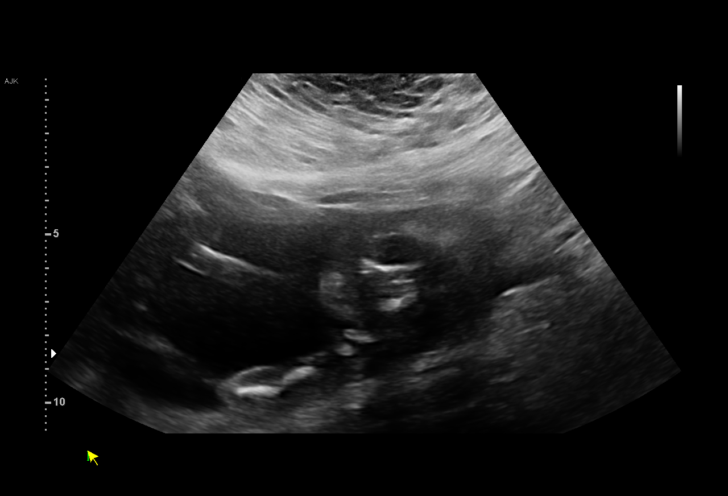
[im 25/97]
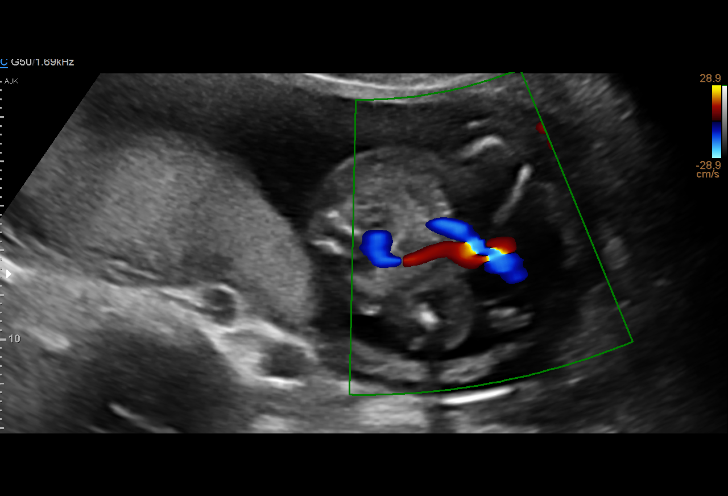
[im 33/97]
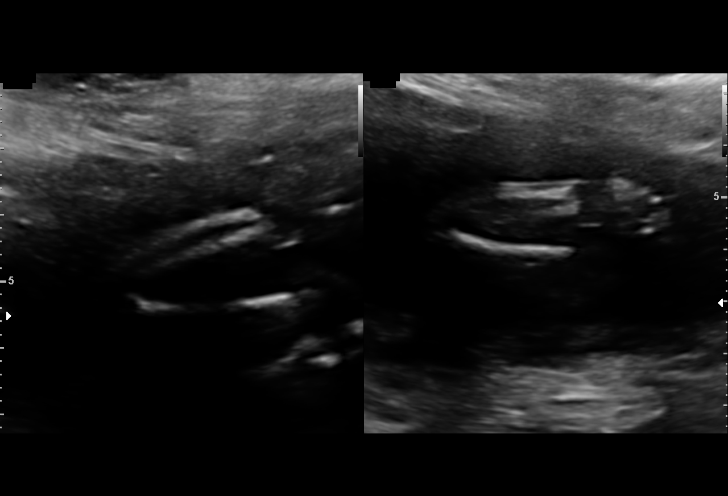
[im 40/97]
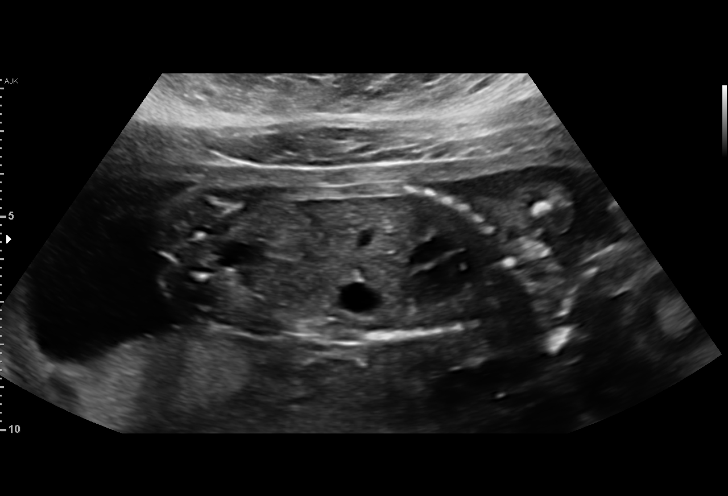
[im 47/97]
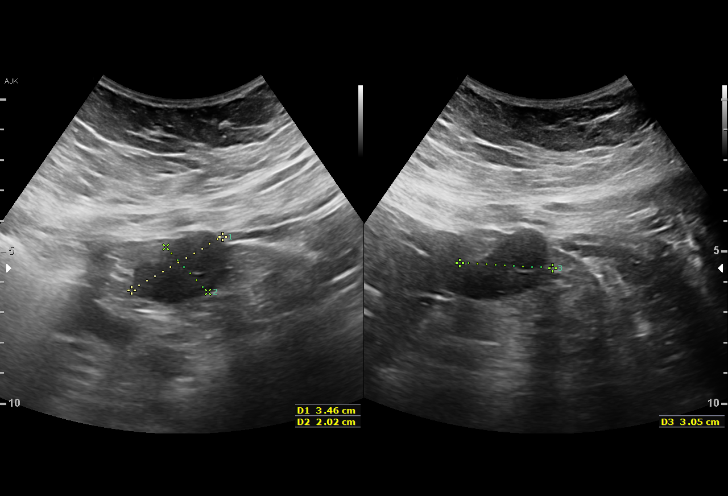
[im 54/97]
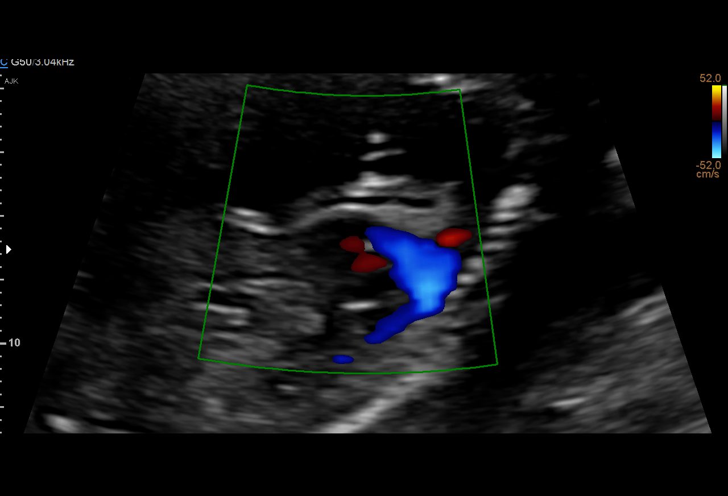
[im 61/97]
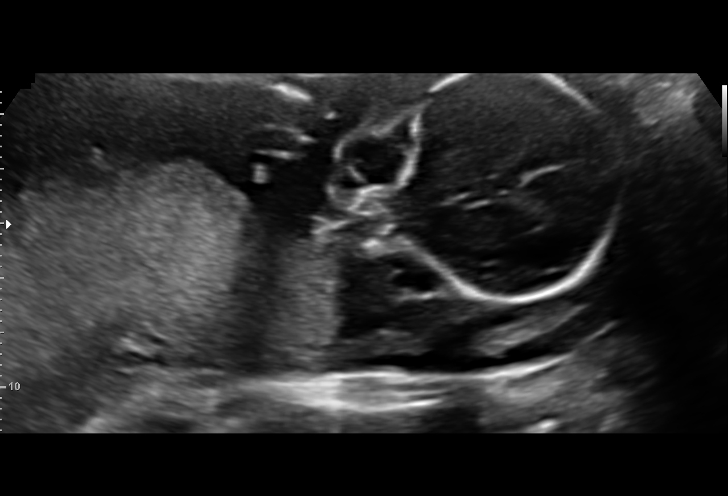
[im 68/97]
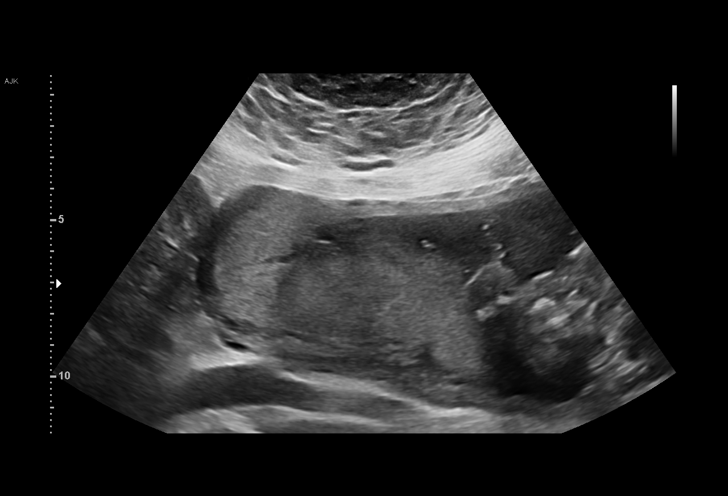
[im 75/97]
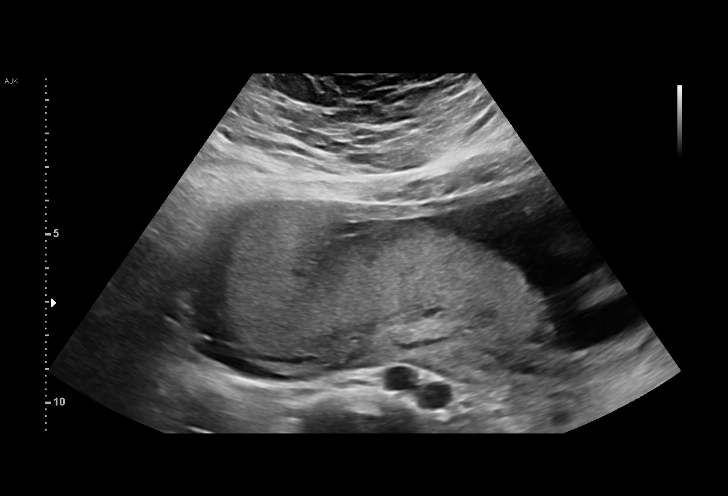
[im 82/97]
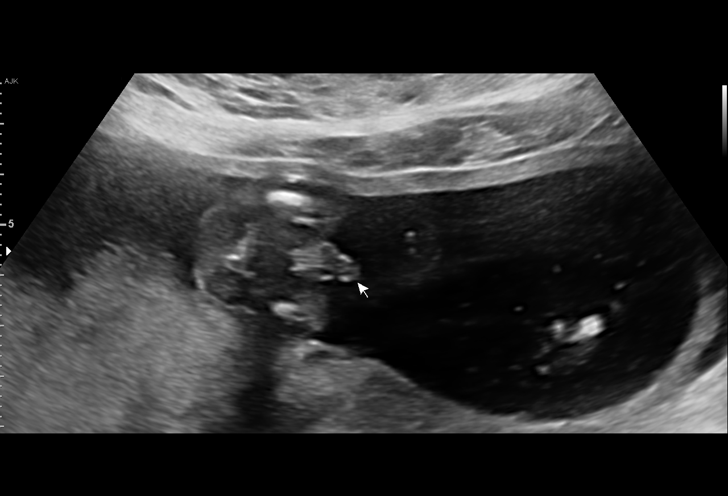
[im 89/97]
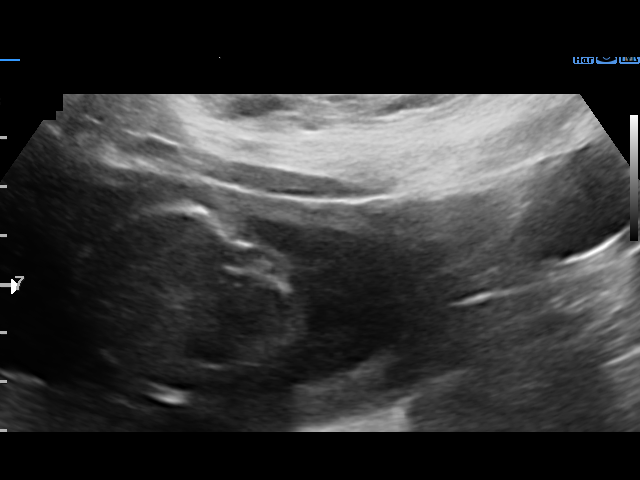
[im 97/97]
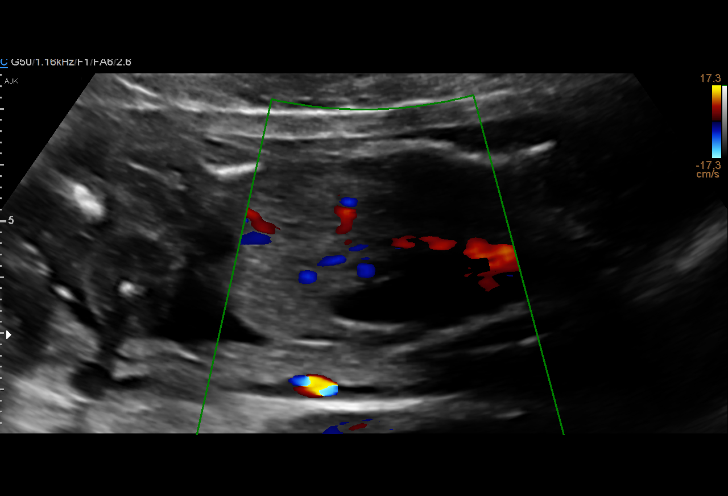

[14 of 28 positions shown; findings below may reference images not displayed]

OB/Gyn Clinic

Indications

19 weeks gestation of pregnancy
Poor obstetric history: Previous gestational
diabetes/PIH
Obesity complicating pregnancy, second
trimester
Encounter for fetal anatomic survey
Maternal care for other isoimmunization,
second trimester, unspecified (anti-S)
Short interval between pregancies, 2nd
trimester
OB History

Blood Type:            Height:  5'1"   Weight (lb):  198       BMI:
Gravidity:    6         Term:   3        Prem:   0        SAB:   1
TOP:          0       Ectopic:  1        Living: 3
Fetal Evaluation

Num Of Fetuses:     1
Fetal Heart         153
Rate(bpm):
Cardiac Activity:   Observed
Presentation:       Variable
Placenta:           Posterior, above cervical os
P. Cord Insertion:  Visualized, central
Amniotic Fluid
AFI FV:      Subjectively within normal limits

Largest Pocket(cm)
3.5
Biometry

BPD:      43.1  mm     G. Age:  19w 0d         52  %    CI:        72.66   %    70 - 86
FL/HC:      17.5   %    16.1 -
HC:      160.8  mm     G. Age:  18w 6d         38  %    HC/AC:      1.09        1.09 -
AC:      147.4  mm     G. Age:  20w 0d         78  %    FL/BPD:     65.2   %
FL:       28.1  mm     G. Age:  18w 4d         30  %    FL/AC:      19.1   %    20 - 24
CER:      19.4  mm     G. Age:  18w 5d         41  %
NFT:       8.3  mm
CM:          6  mm

Est. FW:     285  gm    0 lb 10 oz      50  %
Gestational Age

LMP:           19w 0d        Date:  02/22/17                 EDD:   11/29/17
U/S Today:     19w 1d                                        EDD:   11/28/17
Best:          19w 0d     Det. By:  LMP  (02/22/17)          EDD:   11/29/17
Anatomy

Cranium:               Appears normal         Aortic Arch:            Not well visualized
Cavum:                 Appears normal         Ductal Arch:            Appears normal
Ventricles:            Appears normal         Diaphragm:              Appears normal
Choroid Plexus:        Appears normal         Stomach:                Appears normal, left
sided
Cerebellum:            Appears normal         Abdomen:                Appears normal
Posterior Fossa:       Not well visualized    Abdominal Wall:         Appears nml (cord
insert, abd wall)
Nuchal Fold:           Not well visualized    Cord Vessels:           Appears normal (3
vessel cord)
Face:                  Appears normal         Kidneys:                Appear normal
(orbits and profile)
Lips:                  Not well visualized    Bladder:                Appears normal
Thoracic:              Appears normal         Spine:                  Appears normal
Heart:                 Not well visualized    Upper Extremities:      Appears normal
RVOT:                  Appears normal         Lower Extremities:      Appears normal
LVOT:                  Appears normal

Other:  Fetus appears to be a male. Nasal bone visualized.Technically
difficult due to maternal habitus.
Cervix Uterus Adnexa

Cervix
Length:           5.32  cm.
Normal appearance by transabdominal scan.

Uterus
No abnormality visualized.

Left Ovary
Not visualized.
Right Ovary
Not visualized.

Adnexa:       No abnormality visualized.
Impression

Singleton intrauterine pregnancy at 19 weeks 0 days
gestation with fetal cardiac activity
Variable presentation
Posterior placenta without evidence of previa
Normal appearing fetal growth and amniotic fluid volume
Limited fetal anatomic survey secondary to maternal habitus
Normal appearing cervical length
Recommendations

Recommend follow-up ultrasound examination in 
 4 weeks
to complete fetal anatomic survey

## 2020-05-20 ENCOUNTER — Other Ambulatory Visit: Payer: Self-pay

## 2020-05-20 ENCOUNTER — Ambulatory Visit: Payer: Self-pay | Admitting: Obstetrics and Gynecology

## 2020-05-20 ENCOUNTER — Encounter: Payer: Self-pay | Admitting: Obstetrics and Gynecology

## 2020-05-20 VITALS — BP 125/79 | HR 47 | Ht 61.0 in | Wt 228.0 lb

## 2020-05-20 DIAGNOSIS — Z01419 Encounter for gynecological examination (general) (routine) without abnormal findings: Secondary | ICD-10-CM

## 2020-05-20 DIAGNOSIS — Z6841 Body Mass Index (BMI) 40.0 and over, adult: Secondary | ICD-10-CM

## 2020-05-20 NOTE — Progress Notes (Signed)
HPI:      [name] is a 34 y.o. [id number] who LMP was Riggs LMP recorded.  Subjective:   She presents today for her annual examination.  She states she has not had a "checkup" more than 2 years.  She reports Riggs previous problems with her Pap smear.  She has a tubal ligation for birth control and is having normal regular cycles. She does state that over the last week she has noticed pain in her right breast and possibly a lump.  This is not a cyclic occurrence for her.  She denies nipple discharge. Patient also complains of a "lump" that she has noticed in the vagina and its been there for several years.  She thinks it may be slightly larger.    Hx: The following portions of the patient's history were reviewed and updated as appropriate:             She  has a past medical history of Anemia and Ectopic pregnancy (2017). She does not have any pertinent problems on file. She  has a past surgical history that includes Wisdom tooth extraction (2018) and Tubal ligation (Bilateral, 11/12/2017). Her family history includes Cancer in her paternal grandmother; Diabetes in her maternal grandmother and mother; Hypertension in her father. She  reports that she has quit smoking. She has never used smokeless tobacco. She reports that she does not drink alcohol and does not use drugs. She currently has Riggs medications in their medication list. She has Riggs Known Allergies.       Review of Systems:  Review of Systems  Constitutional: Denied constitutional symptoms, night sweats, recent illness, fatigue, fever, insomnia and weight loss.  Eyes: Denied eye symptoms, eye pain, photophobia, vision change and visual disturbance.  Ears/Nose/Throat/Neck: Denied ear, nose, throat or neck symptoms, hearing loss, nasal discharge, sinus congestion and sore throat.  Cardiovascular: Denied cardiovascular symptoms, arrhythmia, chest pain/pressure, edema, exercise intolerance, orthopnea and palpitations.  Respiratory:  Denied pulmonary symptoms, asthma, pleuritic pain, productive sputum, cough, dyspnea and wheezing.  Gastrointestinal: Denied, gastro-esophageal reflux, melena, nausea and vomiting.  Genitourinary: Denied genitourinary symptoms including symptomatic vaginal discharge, pelvic relaxation issues, and urinary complaints.  Musculoskeletal: Denied musculoskeletal symptoms, stiffness, swelling, muscle weakness and myalgia.  Dermatologic: Denied dermatology symptoms, rash and scar.  Neurologic: Denied neurology symptoms, dizziness, headache, neck pain and syncope.  Psychiatric: Denied psychiatric symptoms, anxiety and depression.  Endocrine: Denied endocrine symptoms including hot flashes and night sweats.   Meds:   Riggs current outpatient medications on file prior to visit.   Riggs current facility-administered medications on file prior to visit.    Objective:     Vitals:   05/20/20 0739  BP: 125/79  Pulse: (!) 47              Physical examination General NAD, Conversant  HEENT Atraumatic; Op clear with mmm.  Normo-cephalic. Pupils reactive. Anicteric sclerae  Thyroid/Neck Smooth without nodularity or enlargement. Normal ROM.  Neck Supple.  Skin Riggs rashes, lesions or ulceration. Normal palpated skin turgor. Riggs nodularity.  Breasts: Riggs masses or discharge.  Symmetric.  Riggs axillary adenopathy.  Pain localized to chest sidewall.  Riggs breast tissue in that area.  Lungs: Clear to auscultation.Riggs rales or wheezes. Normal Respiratory effort, Riggs retractions.  Heart: NSR.  Riggs murmurs or rubs appreciated. Riggs periferal edema  Abdomen: Soft.  Non-tender.  Riggs masses.  Riggs HSM. Riggs hernia  Extremities: Moves all appropriately.  Normal ROM for age. Riggs lymphadenopathy.  Neuro:  Oriented to PPT.  Normal mood. Normal affect.     Pelvic:   Vulva: Normal appearance.  Riggs lesions.  Vagina: Riggs lesions or abnormalities noted.  Riggs masses or abnormalities noted.  Support: Normal pelvic support.  Urethra Riggs masses  tenderness or scarring.  Meatus Normal size without lesions or prolapse.  Cervix: Normal appearance.  Riggs lesions.  Anus: Normal exam.  Riggs lesions.  Perineum: Normal exam.  Riggs lesions.        Bimanual   Uterus: Normal size.  Non-tender.  Mobile.  AV.  Adnexae: Riggs masses.  Non-tender to palpation.  Cul-de-sac: Negative for abnormality.      Assessment:    [id number] Patient Active Problem List   Diagnosis Date Noted  . Unwanted fertility 10/17/2017  . Blurred vision, left eye 09/30/2017  . Maternal iron deficiency anemia affecting pregnancy in third trimester, antepartum 08/26/2017  . Pica in adults 08/10/2017  . Low hemoglobin A2 level in blood 05/17/2017     1. Well woman exam with routine gynecological exam   2. Morbid obesity with BMI of 40.0-44.9, adult (HCC)     Normal exam.  Possible pulled muscle abdominal strain right side.  Patient could not tell me where the vaginal lump was located and I could identify Riggs abnormalities on exam.   Plan:            1.  Basic Screening Recommendations The basic screening recommendations for asymptomatic women were discussed with the patient during her visit.  The age-appropriate recommendations were discussed with her and the rational for the tests reviewed.  When I am informed by the patient that another primary care physician has previously obtained the age-appropriate tests and they are up-to-date, only outstanding tests are ordered and referrals given as necessary.  Abnormal results of tests will be discussed with her when all of her results are completed.  Routine preventative health maintenance measures emphasized: Exercise/Diet/Weight control, Tobacco Warnings, Alcohol/Substance use risks and Stress Management  Orders Riggs orders of the defined types were placed in this encounter.   Riggs orders of the defined types were placed in this encounter.       F/U  Return in about 1 year (around 05/20/2021) for Annual Physical.  Elonda Husky, M.D. 05/20/2020 8:04 AM

## 2021-09-23 ENCOUNTER — Ambulatory Visit
Admission: EM | Admit: 2021-09-23 | Discharge: 2021-09-23 | Disposition: A | Discharge status: Home / Self Care | Payer: 59 | Attending: Emergency Medicine | Admitting: Emergency Medicine

## 2021-09-23 ENCOUNTER — Other Ambulatory Visit: Payer: Self-pay

## 2021-09-23 ENCOUNTER — Encounter: Payer: Self-pay | Admitting: Hematology

## 2021-09-23 ENCOUNTER — Encounter: Payer: Self-pay | Admitting: Emergency Medicine

## 2021-09-23 DIAGNOSIS — J0100 Acute maxillary sinusitis, unspecified: Secondary | ICD-10-CM | POA: Diagnosis not present

## 2021-09-23 MED ORDER — AMOXICILLIN 875 MG PO TABS: 875.0000 mg | ORAL_TABLET | Freq: Two times a day (BID) | ORAL | 0 refills | Status: AC

## 2021-09-23 NOTE — ED Triage Notes (Signed)
Pt presents with cough, nasal congestion, head feels full x 3 weeks

## 2021-09-23 NOTE — ED Provider Notes (Signed)
Renaldo Fiddler    CSN: [id number] Arrival date & time: 09/23/21  1316      History   Chief Complaint Chief Complaint  Patient presents with   Cough   Nasal Congestion    HPI Jamie Riggs is a 36 y.o. female.  Patient presents with 3-week history of sinus congestion, sinus pressure, postnasal drip, runny nose, cough.  Multiple OTC treatments attempted without relief.  She denies fever, rash, shortness of breath, or other symptoms.  The history is provided by the patient.   Past Medical History:  Diagnosis Date   Anemia    Ectopic pregnancy 2017    Patient Active Problem List   Diagnosis Date Noted   Unwanted fertility 10/17/2017   Blurred vision, left eye 09/30/2017   Maternal iron deficiency anemia affecting pregnancy in third trimester, antepartum 08/26/2017   Pica in adults 08/10/2017   Low hemoglobin A2 level in blood 05/17/2017    Past Surgical History:  Procedure Laterality Date   TUBAL LIGATION Bilateral 11/12/2017   Procedure: POST PARTUM TUBAL LIGATION;  Surgeon: Tereso Newcomer, MD;  Location: [facility];  Service: Gynecology;  Laterality: Bilateral;   WISDOM TOOTH EXTRACTION  2018    OB History     Gravida  6   Para  4   Term  4   Preterm      AB  2   Living  4      SAB  1   IAB      Ectopic  1   Multiple      Live Births  4            Home Medications    Prior to Admission medications   Medication Sig Start Date End Date Taking? Authorizing Provider  amoxicillin (AMOXIL) 875 MG tablet Take 1 tablet (875 mg total) by mouth 2 (two) times daily for 7 days. 09/23/21 09/30/21 Yes Mickie Bail, NP    Family History Family History  Problem Relation Age of Onset   Diabetes Mother    Hypertension Father    Diabetes Maternal Grandmother    Cancer Paternal Grandmother     Social History Social History   Tobacco Use   Smoking status: Former   Smokeless tobacco: Never  Building services engineer Use: Never  used  Substance Use Topics   Alcohol use: No   Drug use: No     Allergies   Patient has no known allergies.   Review of Systems Review of Systems  Constitutional:  Negative for chills and fever.  HENT:  Positive for congestion, postnasal drip, rhinorrhea and sinus pressure. Negative for ear pain and sore throat.   Respiratory:  Positive for cough. Negative for shortness of breath.   Cardiovascular:  Negative for chest pain and palpitations.  Skin:  Negative for color change and rash.  All other systems reviewed and are negative.   Physical Exam Triage Vital Signs ED Triage Vitals [09/23/21 1401]  Enc Vitals Group     BP 122/84     Pulse Rate 80     Resp 18     Temp 98.1 F (36.7 C)     Temp Source Oral     SpO2 98 %     Weight      Height      Head Circumference      Peak Flow      Pain Score 0     Pain Loc  Pain Edu?      Excl. in [place]?    No data found.  Updated Vital Signs BP 122/84 (BP Location: Left Arm)    Pulse 80    Temp 98.1 F (36.7 C) (Oral)    Resp 18    LMP  (LMP Unknown)    SpO2 98%   Visual Acuity Right Eye Distance:   Left Eye Distance:   Bilateral Distance:    Right Eye Near:   Left Eye Near:    Bilateral Near:     Physical Exam Vitals and nursing note reviewed.  Constitutional:      General: She is not in acute distress.    Appearance: She is well-developed. She is not ill-appearing.  HENT:     Right Ear: Tympanic membrane normal.     Left Ear: Tympanic membrane normal.     Nose: Congestion present.     Mouth/Throat:     Mouth: Mucous membranes are moist.     Pharynx: Oropharynx is clear.  Cardiovascular:     Rate and Rhythm: Normal rate and regular rhythm.     Heart sounds: Normal heart sounds.  Pulmonary:     Effort: Pulmonary effort is normal. No respiratory distress.     Breath sounds: Normal breath sounds.  Musculoskeletal:     Cervical back: Neck supple.  Skin:    General: Skin is warm and dry.     Capillary  Refill: Capillary refill takes less than 2 seconds.  Neurological:     Mental Status: She is alert.  Psychiatric:        Mood and Affect: Mood normal.        Behavior: Behavior normal.     UC Treatments / Results  Labs (all labs ordered are listed, but only abnormal results are displayed) Labs Reviewed - No data to display  EKG   Radiology No results found.  Procedures Procedures (including critical care time)  Medications Ordered in UC Medications - No data to display  Initial Impression / Assessment and Plan / UC Course  I have reviewed the triage vital signs and the nursing notes.  Pertinent labs & imaging results that were available during my care of the patient were reviewed by me and considered in my medical decision making (see chart for details).   Acute sinusitis.  Patient has been symptomatic for 3 weeks and is not improving with OTC treatment.  Treating today with amoxicillin.  Discussed other symptomatic treatment including ibuprofen and plain Mucinex.  Instructed her to follow-up with her PCP if her symptoms are not improving.  She agrees to plan of care.   Final Clinical Impressions(s) / UC Diagnoses   Final diagnoses:  Acute non-recurrent maxillary sinusitis     Discharge Instructions      Take the amoxicillin as directed.  Follow up with your primary care provider if your symptoms are not improving.         ED Prescriptions     Medication Sig Dispense Auth. Provider   amoxicillin (AMOXIL) 875 MG tablet Take 1 tablet (875 mg total) by mouth 2 (two) times daily for 7 days. 14 tablet Sharion Balloon, NP      PDMP not reviewed this encounter.   Sharion Balloon, NP 09/23/21 1438

## 2021-09-23 NOTE — Discharge Instructions (Addendum)
Take the amoxicillin as directed.  Follow up with your primary care provider if your symptoms are not improving.   ° ° °

## 2021-12-24 ENCOUNTER — Ambulatory Visit
Admission: EM | Admit: 2021-12-24 | Discharge: 2021-12-24 | Disposition: A | Discharge status: Home / Self Care | Payer: 59

## 2021-12-24 ENCOUNTER — Ambulatory Visit (INDEPENDENT_AMBULATORY_CARE_PROVIDER_SITE_OTHER): Payer: 59

## 2021-12-24 DIAGNOSIS — R059 Cough, unspecified: Secondary | ICD-10-CM | POA: Diagnosis not present

## 2021-12-24 DIAGNOSIS — J209 Acute bronchitis, unspecified: Secondary | ICD-10-CM

## 2021-12-24 DIAGNOSIS — R079 Chest pain, unspecified: Secondary | ICD-10-CM

## 2021-12-24 MED ORDER — ALBUTEROL SULFATE HFA 108 (90 BASE) MCG/ACT IN AERS: 2.0000 | INHALATION_SPRAY | RESPIRATORY_TRACT | 0 refills | Status: DC | PRN

## 2021-12-24 MED ORDER — ALBUTEROL SULFATE (2.5 MG/3ML) 0.083% IN NEBU
2.5000 mg | INHALATION_SOLUTION | Freq: Once | RESPIRATORY_TRACT | Status: AC
  Administered 2021-12-24: 2.5 mg via RESPIRATORY_TRACT

## 2021-12-24 MED ORDER — BENZONATATE 200 MG PO CAPS: 200.0000 mg | ORAL_CAPSULE | Freq: Three times a day (TID) | ORAL | 0 refills | Status: DC | PRN

## 2021-12-24 MED ORDER — AZITHROMYCIN 250 MG PO TABS: ORAL_TABLET | ORAL | 0 refills | Status: DC

## 2021-12-24 NOTE — ED Provider Notes (Signed)
?[facility] ? ? ? ?CSN: [id number] ?Arrival date & time: 12/24/21  1430 ? ? ?  ? ?History   ?Chief Complaint ?Chief Complaint  ?Patient presents with  ? Cough  ?  I've had it ongoing call for about 2 weeks now but now my left side starting to hurt in the ribs - Entered by patient  ? ? ?HPI ?Jamie Riggs is a 36 y.o. female who had URI symptoms, and gradually her cough is getting worse this week, and started having L lateral rib pain since yesterday. All her 7 kids have had URI's, but her youngest had pneumonia.  ? ? ? ?Past Medical History:  ?Diagnosis Date  ? Anemia   ? Ectopic pregnancy 2017  ? ? ?Patient Active Problem List  ? Diagnosis Date Noted  ? Unwanted fertility 10/17/2017  ? Blurred vision, left eye 09/30/2017  ? Maternal iron deficiency anemia affecting pregnancy in third trimester, antepartum 08/26/2017  ? 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 Low hemoglobin A2 level in blood 05/17/2017  ? ? ?Past Surgical History:  ?Procedure Laterality Date  ? TUBAL LIGATION Bilateral 11/12/2017  ? Procedure: POST PARTUM TUBAL LIGATION;  Surgeon: Tereso Newcomer, MD;  Location: [facility];  Service: Gynecology;  Laterality: Bilateral;  ? WISDOM TOOTH EXTRACTION  2018  ? ? ?OB History   ? ? Gravida  ?6  ? Para  ?4  ? Term  ?4  ? Preterm  ?   ? AB  ?2  ? Living  ?4  ?  ? ? SAB  ?1  ? IAB  ?   ? Ectopic  ?1  ? Multiple  ?   ? Live Births  ?4  ?   ?  ?  ? ? ? ?Home Medications   ? ?Prior to Admission medications   ?Medication Sig Start Date End Date Taking? Authorizing Provider  ?albuterol (VENTOLIN HFA) 108 (90 Base) MCG/ACT inhaler Inhale 2 puffs into the lungs every 4 (four) hours as needed for wheezing or shortness of breath. 12/24/21  Yes Rodriguez-Southworth, Nettie Elm, PA-C  ?azithromycin (ZITHROMAX) 250 MG tablet 2 today, then one qd x 4 day 12/24/21  Yes Rodriguez-Southworth, Nettie Elm, PA-C  ?benzonatate (TESSALON) 200 MG capsule Take 1 capsule (200 mg total) by mouth 3 (three) times daily as needed for  cough. 12/24/21  Yes Rodriguez-Southworth, Nettie Elm, PA-C  ?escitalopram (LEXAPRO) 5 MG tablet Take 5 mg by mouth daily. 12/14/21   [provider]  ? ? ?Family History ?Family History  ?Problem Relation Age of Onset  ? Diabetes Mother   ? Hypertension Father   ? Diabetes Maternal Grandmother   ? Cancer Paternal Grandmother   ? ? ?Social History ?Social History  ? ?Tobacco Use  ? Smoking status: Former  ? Smokeless tobacco: Never  ?Vaping Use  ? Vaping Use: Never used  ?Substance Use Topics  ? Alcohol use: No  ? Drug use: No  ? ? ? ?Allergies   ?Patient has no known allergies. ? ? ?Review of Systems ?Review of Systems  ?Constitutional:  Positive for diaphoresis. Negative for appetite change, chills, fatigue and fever.  ?HENT:  Positive for postnasal drip and rhinorrhea. Negative for congestion, ear discharge, ear pain and sore throat.   ?Eyes:  Negative for discharge.  ?Respiratory:  Positive for cough and wheezing. Negative for chest tightness and shortness of breath.   ?     + upper chest soreness from coughing  ?Musculoskeletal:  Negative for myalgias.  ?  Skin:  Negative for rash.  ?Neurological:  Negative for headaches.  ?Hematological:  Negative for adenopathy.  ? ? ?Physical Exam ?Triage Vital Signs ?ED Triage Vitals  ?Enc Vitals Group  ?   BP 12/24/21 1536 113/82  ?   Pulse Rate 12/24/21 1536 74  ?   Resp 12/24/21 1536 18  ?   Temp 12/24/21 1536 98.7 ?F (37.1 ?C)  ?   Temp Source 12/24/21 1536 Oral  ?   SpO2 12/24/21 1536 97 %  ?   Weight --   ?   Height --   ?   Head Circumference --   ?   Peak Flow --   ?   Pain Score 12/24/21 1545 0  ?   Pain Loc --   ?   Pain Edu? --   ?   Excl. in [place]? --   ? ?No data found. ? ?Updated Vital Signs ?BP 113/82 (BP Location: Right Arm)   Pulse 62   Temp 98.7 ?F (37.1 ?C) (Oral)   Resp 18   LMP 12/16/2021 (Approximate)   SpO2 99%  ? ?Visual Acuity ?Right Eye Distance:   ?Left Eye Distance:   ?Bilateral Distance:   ? ?Right Eye Near:   ?Left Eye Near:    ?Bilateral  Near:    ? ?Physical Exam ?Constitutional:   ?   General: He is not in acute distress. ?   Appearance: He is not toxic-appearing.  ?HENT:  ?   Head: Normocephalic.  ?   Right Ear: Tympanic membrane, ear canal and external ear normal.  ?   Left Ear: Ear canal and external ear normal.  ?   Nose: Nose normal.  ?   Mouth/Throat:  ?   Mouth: Mucous membranes are moist.  ?   Pharynx: Oropharynx is clear.  ?Eyes:  ?   General: No scleral icterus. ?   Conjunctiva/sclera: Conjunctivae normal.  ?Cardiovascular:  ?   Rate and Rhythm: Normal rate and regular rhythm.  ?   Heart sounds: No murmur heard.  ? ?Pulmonary:  ?   Effort: Pulmonary effort is normal. No respiratory distress.  ?   Breath sounds: Wheezing present on bases of lungs ?  ?Musculoskeletal:     ?   General: Normal range of motion.  ?   Cervical back: Neck supple.  ?Lymphadenopathy:  ?   Cervical: No cervical adenopathy.  ?Skin: ?   General: Skin is warm and dry.  ?   Findings: No rash.  ?Neurological:  ?   Mental Status: He is alert and oriented to person, place, and time.  ?   Gait: Gait normal.  ?Psychiatric:     ?   Mood and Affect: Mood normal.     ?   Behavior: Behavior normal.     ?   Thought Content: Thought content normal.     ?   Judgment: Judgment normal.  ? ? ?UC Treatments / Results  ?Labs ?(all labs ordered are listed, but only abnormal results are displayed) ?Labs Reviewed - No data to display ? ?EKG ? ? ?Radiology ?DG Chest 2 View ? ?Result Date: 12/24/2021 ?CLINICAL DATA:  Cough left chest pain EXAM: CHEST - 2 VIEW COMPARISON:  None. FINDINGS: The heart size and mediastinal contours are within normal limits. Both lungs are clear. The visualized skeletal structures are unremarkable. IMPRESSION: No active cardiopulmonary disease. Electronically Signed   By: Ernie AvenaPalani  Rathinasamy M.D.   On: 12/24/2021 16:50   ? ?Procedures ?  Procedures (including critical care time) ? ?Medications Ordered in UC ?Medications  ?albuterol (PROVENTIL) (2.5 MG/3ML) 0.083%  nebulizer solution 2.5 mg (2.5 mg Nebulization Given 12/24/21 1647)  ? ? ?Initial Impression / Assessment and Plan / UC Course  ?I have reviewed the triage vital signs and the nursing notes. ? ?Pertinent  imaging results that were available during my care of the patient were reviewed by me and considered in my medical decision making (see chart for details). ? ? ?She was given Albuterol neb which helped with the cough attacks. ? ?Acute bronchitis ?I placed her on Azithromycin, Tessalon and Albuterol inhaler as noted.  ? ? ?Final Clinical Impressions(s) / UC Diagnoses  ? ?Final diagnoses:  ?Acute bronchitis, unspecified organism  ? ?Discharge Instructions   ?None ?  ? ?ED Prescriptions   ? ? Medication Sig Dispense Auth. Provider  ? azithromycin (ZITHROMAX) 250 MG tablet 2 today, then one qd x 4 day 6 each Rodriguez-Southworth, Nettie Elm, PA-C  ? albuterol (VENTOLIN HFA) 108 (90 Base) MCG/ACT inhaler Inhale 2 puffs into the lungs every 4 (four) hours as needed for wheezing or shortness of breath. 16 g Rodriguez-Southworth, Nettie Elm, PA-C  ? benzonatate (TESSALON) 200 MG capsule Take 1 capsule (200 mg total) by mouth 3 (three) times daily as needed for cough. 30 capsule Rodriguez-Southworth, Nettie Elm, PA-C  ? ?  ? ?PDMP not reviewed this encounter. ?  ?Garey Ham, PA-C ?12/24/21 1700 ? ?

## 2021-12-24 NOTE — ED Triage Notes (Signed)
Patient presents to Urgent Care with complaints of productive cough x 2 weeks Treating symptoms with benadryl, Claritin, and OTC cough med no improvement.  ? ?Denies fever, chest pain, or SOB.  ?

## 2022-02-06 ENCOUNTER — Other Ambulatory Visit: Payer: Self-pay

## 2022-02-06 ENCOUNTER — Ambulatory Visit
Admission: EM | Admit: 2022-02-06 | Discharge: 2022-02-06 | Disposition: A | Discharge status: Home / Self Care | Payer: 59 | Attending: Emergency Medicine | Admitting: Emergency Medicine

## 2022-02-06 ENCOUNTER — Ambulatory Visit (INDEPENDENT_AMBULATORY_CARE_PROVIDER_SITE_OTHER): Payer: 59

## 2022-02-06 ENCOUNTER — Encounter: Payer: Self-pay | Admitting: Emergency Medicine

## 2022-02-06 DIAGNOSIS — M79641 Pain in right hand: Secondary | ICD-10-CM

## 2022-02-06 HISTORY — DX: Anxiety disorder, unspecified: F41.9

## 2022-02-06 NOTE — ED Triage Notes (Signed)
Right index finger pain in base of finger/joint.  No known injury.  This finger has been painful for 4-5 days.  Patient is right handed, no known injury.    For the last 2 years has had intermittent episodes of hands/wrists are weak.  May occur once a month.  Patient does not have a pcp.  Patient has tried tylenol

## 2022-02-06 NOTE — ED Provider Notes (Signed)
Jamie Riggs    CSN: [id number] Arrival date & time: 02/06/22  3329      History   Chief Complaint Chief Complaint  Patient presents with   Finger Injury    Knuckle pain at joint little swollen - Entered by patient    HPI Jamie Riggs is a 36 y.o. female.  Patient presents with pain in her right hand at the base of her index finger x4 days.  No falls or injury.  She noted swelling this morning.  No numbness, weakness, paresthesias, wounds, redness, bruising, fever, or other symptoms.  Treatment attempted at home with Tylenol; none taken today.  She reports history of "weak wrists" for several years but has not previous had this pain.  Her medical history includes anemia, anxiety, pica.  The history is provided by the patient and medical records.   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 Authorizing Provider  albuterol (VENTOLIN HFA) 108 (90 Base) MCG/ACT inhaler Inhale 2 puffs into the lungs every 4 (four) hours as needed for wheezing or shortness of breath. Patient not  taking: Reported on 02/06/2022 12/24/21   Rodriguez-Southworth, Nettie Elm, PA-C  azithromycin [facility]) 250 MG tablet 2 today, then one qd x 4 day Patient not taking: Reported on 02/06/2022 12/24/21   Rodriguez-Southworth, Nettie Elm, PA-C  benzonatate (TESSALON) 200 MG capsule Take 1 capsule (200 mg total) by mouth 3 (three) times daily as needed for cough. Patient not taking: Reported on 02/06/2022 12/24/21   Rodriguez-Southworth, Nettie Elm, PA-C  escitalopram (LEXAPRO) 5 MG tablet Take 5 mg by mouth daily. Patient not taking: Reported on 02/06/2022 12/14/21   [provider]    Family History Family History  Problem Relation Age of Onset   Diabetes Mother    Hypertension Father    Diabetes Maternal Grandmother    Cancer Paternal Grandmother     Social History Social History   Tobacco Use   Smoking status: Former   Smokeless tobacco: Never  Building services engineer Use: Never used  Substance Use Topics   Alcohol use: Yes    Comment: rare   Drug use: No     Allergies   Patient has no known allergies.   Review of Systems Review of Systems  Constitutional:  Negative for chills  and fever.  Musculoskeletal:  Positive for arthralgias and joint swelling.  Skin:  Negative for color change, rash and wound.  Neurological:  Negative for weakness and numbness.  All other systems reviewed and are negative.   Physical Exam Triage Vital Signs ED Triage Vitals  Enc Vitals Group     BP 02/06/22 0935 126/74     Pulse Rate 02/06/22 0935 88     Resp 02/06/22 0935 18     Temp 02/06/22 0935 98.2 F (36.8 C)     Temp src --      SpO2 02/06/22 0935 97 %     Weight --      Height --      Head Circumference --      Peak Flow --      Pain Score 02/06/22 0944 7     Pain Loc --      Pain Edu? --      Excl. in [place]? --    No data found.  Updated Vital Signs BP 126/74   Pulse 88   Temp 98.2 F (36.8 C)   Resp 18   LMP 01/16/2022   SpO2 97%   Visual Acuity Right Eye Distance:   Left  Eye Distance:   Bilateral Distance:    Right Eye Near:   Left Eye Near:    Bilateral Near:     Physical Exam Vitals and nursing note reviewed.  Constitutional:      General: She is not in acute distress.    Appearance: Normal appearance. She is well-developed. She is not ill-appearing.  HENT:     Mouth/Throat:     Mouth: Mucous membranes are moist.  Cardiovascular:     Rate and Rhythm: Normal rate and regular rhythm.  Pulmonary:     Effort: Pulmonary effort is normal. No respiratory distress.  Musculoskeletal:        General: Swelling and tenderness present. No deformity. Normal range of motion.       Hands:     Cervical back: Neck supple.  Skin:    General: Skin is warm and dry.     Capillary Refill: Capillary refill takes less than 2 seconds.     Findings: No bruising, erythema, lesion or rash.  Neurological:     General: No focal deficit present.     Mental Status: She is alert and oriented to person, place, and time.     Sensory: No sensory deficit.     Motor: No weakness.  Psychiatric:        Mood and Affect: Mood normal.        Behavior: Behavior normal.     UC Treatments / Results  Labs (all labs ordered are listed, but only abnormal results are displayed) Labs Reviewed - No data to display  EKG   Radiology DG Hand Complete Right  Result Date: 02/06/2022 CLINICAL DATA:  Index finger pain for 4 days. No known injury. Initial encounter. EXAM: RIGHT HAND - COMPLETE 3 VIEW COMPARISON:  None Available. FINDINGS: There is no evidence of fracture or dislocation. There is no evidence of arthropathy or other focal bone abnormality. Soft tissues are unremarkable. IMPRESSION: Negative. Electronically Signed   By: Harmon PierJeffrey  Hu M.D.   On: 02/06/2022 10:33    Procedures Procedures (including critical care time)  Medications Ordered in UC Medications - No data to display  Initial Impression / Assessment and Plan / UC Course  I have reviewed the triage vital signs and  the  nursing notes.  Pertinent labs & imaging results that were available during my care of the patient were reviewed by me and considered in my medical decision making (see chart for details).    Right hand pain.  Xray negative.  Discussed symptomatic treatment rest, elevation, ice packs, ibuprofen or Tylenol.  Instructed patient to follow-up with an orthopedist if her symptoms are not improving.  Contact information for [facility] provided as they are on-call today.  Education provided on hand pain.  Patient agrees to plan of care.  Final Clinical Impressions(s) / UC Diagnoses   Final diagnoses:  Right hand pain     Discharge Instructions      Take ibuprofen or Tylenol as needed.  Rest and elevate your hand.  Apply ice packs 2-3 times a day for up to 20 minutes each. Follow up with an orthopedist if your symptoms are not improving.        ED Prescriptions   None    PDMP not reviewed this encounter.   Mickie Bail, NP 02/06/22 1046

## 2022-02-06 NOTE — Discharge Instructions (Addendum)
Take ibuprofen or Tylenol as needed.  Rest and elevate your hand.  Apply ice packs 2-3 times a day for up to 20 minutes each. Follow up with an orthopedist if your symptoms are not improving.

## 2022-06-16 ENCOUNTER — Other Ambulatory Visit: Payer: Self-pay | Admitting: Emergency Medicine

## 2022-06-16 ENCOUNTER — Encounter: Payer: Self-pay | Admitting: Hematology

## 2022-06-16 ENCOUNTER — Telehealth: Payer: Self-pay

## 2022-06-16 ENCOUNTER — Ambulatory Visit
Admission: RE | Admit: 2022-06-16 | Discharge: 2022-06-16 | Disposition: A | Discharge status: Home / Self Care | Payer: BLUE CROSS/BLUE SHIELD | Source: Ambulatory Visit | Attending: Emergency Medicine | Admitting: Emergency Medicine

## 2022-06-16 VITALS — BP 117/80 | HR 77 | Temp 98.1°F | Resp 18 | Ht 61.0 in | Wt 220.0 lb

## 2022-06-16 DIAGNOSIS — N644 Mastodynia: Secondary | ICD-10-CM

## 2022-06-16 DIAGNOSIS — N6315 Unspecified lump in the right breast, overlapping quadrants: Secondary | ICD-10-CM

## 2022-06-16 MED ORDER — MICONAZOLE NITRATE 2 % EX OINT: 1.0000 | TOPICAL_OINTMENT | Freq: Two times a day (BID) | CUTANEOUS | 0 refills | Status: DC

## 2022-06-16 MED ORDER — NAPROXEN 500 MG PO TABS: 500.0000 mg | ORAL_TABLET | Freq: Two times a day (BID) | ORAL | 0 refills | Status: DC

## 2022-06-16 NOTE — ED Triage Notes (Signed)
Patient to Urgent Care with complaints of left breast discomfort, reports initially she started having itching around her nipple but then started having sharp pains throughout her breast. At times she feels as though her breast feels really warm. Also reports some dizziness/ lightheadedness, happens intermittently. Symptoms started 2-3 weeks ago, at first they were associated with her menstrual cycle but now they are consistent. Wears soft bras/ no wire. Denies any history of the same.   Patient also reports some right sided breast tenderness that started 2-3 days ago.

## 2022-06-16 NOTE — Discharge Instructions (Signed)
I have put in referral to the [facility].  Please call them and set up screening mammogram with ultrasound if necessary you do not hear from them in the next several days.  In the meantime, take Naprosyn with 1000 mg of Tylenol twice a day, try the miconazole ointment for your nipples.

## 2022-06-16 NOTE — ED Provider Notes (Signed)
HPI  SUBJECTIVE:  Jamie Riggs is a 36 y.o. female who presents with 2 to 3 weeks of left breast pain located around the nipple and the left upper quadrant.  She states it started with itching around the nipple.  She states that she had similar symptoms starting in her right breast 2 to 3 days ago.  No overlying erythema, swelling, palpable mass, nipple drainage, skin changes, unintentional weight loss, night sweats, fevers.  No antipyretic in the past 6 hours.  She tried Tylenol, ibuprofen, warm showers and cool compresses.  The Tylenol and ibuprofen helps.  Symptoms are worse with lying on her breast.  She has never had symptoms like this before.  She does not do breast self exams.  She has a past medical history of prediabetes.  No history of MRSA, breast cancer.  She has never had a mammogram before.  Her aunt, and paternal grandmother have breast cancer.  Her mother had a breast lump, which was found to be noncancerous.  LMP: At the end of August.  She denies the possibility of being pregnant.  PCP: None    Past Medical History:  Diagnosis Date   Anemia    Anxiety    Ectopic pregnancy 2017    Past Surgical History:  Procedure Laterality Date   ABDOMINAL SURGERY     TUBAL LIGATION Bilateral 11/12/2017   Procedure: POST PARTUM TUBAL LIGATION;  Surgeon: Osborne Oman, MD;  Location: [facility];  Service: Gynecology;  Laterality: Bilateral;   WISDOM TOOTH EXTRACTION  2018    Family History  Problem Relation Age of Onset   Diabetes Mother    Hypertension Father    Diabetes Maternal Grandmother    Cancer Paternal Grandmother     Social History   Tobacco Use   Smoking status: Former   Smokeless tobacco: Never  Scientific laboratory technician Use: Never used  Substance Use Topics   Alcohol use: Yes    Comment: rare   Drug use: No    No current facility-administered medications for this encounter.  Current Outpatient Medications:    Miconazole Nitrate 2 % OINT, Apply 1  Application topically 2 (two) times daily. Apply until rash is gone and for 1 week thereafter, Disp: 56 g, Rfl: 0   naproxen (NAPROSYN) 500 MG tablet, Take 1 tablet (500 mg total) by mouth 2 (two) times daily., Disp: 20 tablet, Rfl: 0   escitalopram (LEXAPRO) 5 MG tablet, Take 5 mg by mouth daily. (Patient not taking: Reported on 02/06/2022), Disp: , Rfl:   No Known Allergies   ROS  As noted in HPI.   Physical Exam  BP 117/80   Pulse 77   Temp 98.1 F (36.7 C)   Resp 18   Ht 5\' 1"  (1.549 m)   Wt 99.8 kg   LMP 05/20/2022   SpO2 97%   BMI 41.57 kg/m   Constitutional: Well developed, well nourished, no acute distress Eyes:  EOMI, conjunctiva normal bilaterally HENT: Normocephalic, atraumatic,mucus membranes moist Respiratory: Normal inspiratory effort Breasts: Normal appearance bilaterally.  No erythema, skin changes, expressible nipple drainage bilaterally.  Nipples appear normal.   Right breast: Tenderness in the 9 o'clock position, right axilla, tender mass in the 6 o'clock position. Left breast: Tenderness in the nipple area in the 9:00 region.  No appreciable masses.  No axillary lymphadenopathy. Patient declined chaperone for breast exam. Cardiovascular: Normal rate GI: nondistended skin: no rash Musculoskeletal: no deformities Neurologic: Alert & oriented x 3,  no focal neuro deficits Psychiatric: Speech and behavior appropriate   ED Course   Medications - No data to display  Orders Placed This Encounter  Procedures   Ambulatory referral to Breast Clinic    Referral Priority:   Routine    Referral Type:   Consultation    Referral Reason:   Specialty Services Required    Requested Specialty:   Breast Surgery    Number of Visits Requested:   1   Nursing Communication Please set up with a PCP prior to discharge    Please set up with a PCP prior to discharge    Standing Status:   Standing    Number of Occurrences:   1    No results found for this or any  previous visit (from the past 24 hour(s)). No results found.  ED Clinical Impression  1. Breast pain in female   2. Mass overlapping multiple quadrants of right breast      ED Assessment/Plan     There is no evidence of mastitis with no overlying erythema, induration, increased temperature.  She may have fibrocystic breasts, however she does have a tender mass in the 6 o'clock position in the right breast.  I have placed a referral  to the breast center in [place] and ordered a bilateral diagnostic mammography and ultrasound if necessary.  In the meantime, Naprosyn/Tylenol, some miconazole cream in case she has a yeast dermatitis around her nipples.  Staff will set her up with a PCP prior to discharge.  Discussed MDM, treatment plan, and plan for follow-up with patient. patient agrees with plan.   Meds ordered this encounter  Medications   Miconazole Nitrate 2 % OINT    Sig: Apply 1 Application topically 2 (two) times daily. Apply until rash is gone and for 1 week thereafter    Dispense:  56 g    Refill:  0   naproxen (NAPROSYN) 500 MG tablet    Sig: Take 1 tablet (500 mg total) by mouth 2 (two) times daily.    Dispense:  20 tablet    Refill:  0      *This clinic note was created using Scientist, clinical (histocompatibility and immunogenetics). Therefore, there may be occasional mistakes despite careful proofreading.  ?    Domenick Gong, MD 06/17/22 [id number]

## 2022-06-16 NOTE — Telephone Encounter (Signed)
Phone call returned to confirm order for breast clinic/ mammogram referral order from Dr Alphonzo Cruise.

## 2022-06-18 ENCOUNTER — Other Ambulatory Visit: Payer: Self-pay | Admitting: Emergency Medicine

## 2022-06-18 DIAGNOSIS — N6310 Unspecified lump in the right breast, unspecified quadrant: Secondary | ICD-10-CM

## 2022-06-18 DIAGNOSIS — N644 Mastodynia: Secondary | ICD-10-CM

## 2022-07-19 ENCOUNTER — Encounter: Payer: Self-pay | Admitting: Hematology

## 2022-07-20 ENCOUNTER — Encounter: Payer: Self-pay | Admitting: Hematology

## 2022-08-26 ENCOUNTER — Ambulatory Visit
Admission: EM | Admit: 2022-08-26 | Discharge: 2022-08-26 | Disposition: A | Discharge status: Home / Self Care | Payer: BLUE CROSS/BLUE SHIELD | Attending: Emergency Medicine | Admitting: Emergency Medicine

## 2022-08-26 DIAGNOSIS — R197 Diarrhea, unspecified: Secondary | ICD-10-CM | POA: Insufficient documentation

## 2022-08-26 DIAGNOSIS — Z1152 Encounter for screening for COVID-19: Secondary | ICD-10-CM | POA: Insufficient documentation

## 2022-08-26 DIAGNOSIS — B349 Viral infection, unspecified: Secondary | ICD-10-CM | POA: Diagnosis not present

## 2022-08-26 DIAGNOSIS — R058 Other specified cough: Secondary | ICD-10-CM | POA: Diagnosis not present

## 2022-08-26 LAB — RESP PANEL BY RT-PCR (FLU A&B, COVID) ARPGX2
Influenza A by PCR: NEGATIVE
Influenza B by PCR: NEGATIVE
SARS Coronavirus 2 by RT PCR: NEGATIVE

## 2022-08-26 NOTE — ED Triage Notes (Signed)
Patient to Urgent Care with complaints of cough and chest congestion. Symptoms started Monday/ Tuesday. Reports she feels like her cough could be productive but has been unable to expel any mucus. Denies any known fevers.  Has been taking mucinex.

## 2022-08-26 NOTE — ED Provider Notes (Signed)
Roderic Palau    CSN: [id number] Arrival date & time: 08/26/22  0840      History   Chief Complaint Chief Complaint  Patient presents with   Cough    HPI Jamie Riggs is a 36 y.o. female.  Patient presents with 2-day history of congestion and cough.  She also reports mild diarrhea; 1 episode today.  She denies fever, rash, sore throat, shortness of breath, vomiting, or other symptoms.  Treatment at home with Mucinex.  The history is provided by the patient and medical records.    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 Authorizing Provider  citalopram (CELEXA) 10 MG tablet SMARTSIG:1 Tablet(s) By Mouth Every Evening 08/04/22  Yes [provider]  escitalopram (LEXAPRO) 5 MG tablet Take 5 mg by mouth daily. Patient not taking: Reported on 02/06/2022 12/14/21   [provider]  Miconazole Nitrate 2 % OINT Apply 1 Application topically 2 (two) times daily. Apply until rash is gone and for 1 week thereafter 06/16/22   Melynda Ripple,  MD  naproxen (NAPROSYN) 500 MG tablet Take 1 tablet (500 mg total) by mouth 2 (two) times daily. 06/16/22   Melynda Ripple, MD    Family History Family History  Problem Relation Age of Onset   Diabetes Mother    Hypertension Father    Diabetes Maternal Grandmother    Cancer Paternal Grandmother     Social History Social History   Tobacco Use   Smoking status: Former   Smokeless tobacco: Never  Scientific laboratory technician Use: Never used  Substance Use Topics   Alcohol use: Yes    Comment: rare   Drug use: No     Allergies   Patient has no known allergies.   Review of Systems Review of Systems  Constitutional:  Negative for chills and fever.  HENT:  Positive for congestion. Negative for ear pain and sore throat.   Respiratory:  Positive for cough. Negative for shortness of breath.   Cardiovascular:  Negative for chest pain and palpitations.  Gastrointestinal:  Positive for diarrhea. Negative for abdominal pain and vomiting.  Skin:  Negative for color change and rash.  All other systems reviewed and are negative.    Physical Exam  Triage Vital Signs ED Triage Vitals  Enc Vitals Group     BP 08/26/22 0927 97/63     Pulse Rate 08/26/22 0914 (!) 56     Resp 08/26/22 0914 18     Temp 08/26/22 0914 98.6 F (37 C)     Temp src --      SpO2 08/26/22 0914 96 %     Weight 08/26/22 0926 220 lb (99.8 kg)     Height 08/26/22 0926 5\' 1"  (1.549 m)     Head Circumference --      Peak Flow --      Pain Score 08/26/22 0918 5     Pain Loc --      Pain Edu? --      Excl. in Con[place]? --    No data found.  Updated Vital Signs BP 97/63   Pulse (!) 56   Temp 98.6 F (37 C)   Resp 18   Ht 5\' 1"  (1.549 m)   Wt 220 lb (99.8 kg)   SpO2 96%   BMI 41.57 kg/m   Visual Acuity Right Eye Distance:   Left Eye Distance:   Bilateral Distance:    Right Eye Near:   Left Eye Near:    Bilateral Near:     Physical Exam Vitals and nursing note reviewed.  Constitutional:       General: She is not in acute distress.    Appearance: Normal appearance. She is well-developed. She is not ill-appearing.  HENT:     Right Ear: Tympanic membrane normal.     Left Ear: Tympanic membrane normal.     Nose: Nose normal.     Mouth/Throat:     Mouth: Mucous membranes are moist.     Pharynx: Oropharynx is clear.  Cardiovascular:     Rate and Rhythm: Normal rate and regular rhythm.     Heart sounds: Normal heart sounds.  Pulmonary:     Effort: Pulmonary effort is normal. No respiratory distress.     Breath sounds: Normal breath sounds.  Abdominal:     General: Bowel sounds are normal.     Palpations: Abdomen is soft.     Tenderness: There is no abdominal tenderness. There is no guarding or rebound.  Musculoskeletal:     Cervical back: Neck supple.  Skin:    General: Skin is warm and dry.  Neurological:     Mental Status: She is alert.  Psychiatric:        Mood and Affect: Mood normal.        Behavior: Behavior normal.      UC Treatments / Results  Labs (all labs ordered are listed, but only abnormal results are displayed) Labs Reviewed  RESP PANEL BY RT-PCR (FLU A&B, COVID) [id number]    EKG   Radiology No results found.  Procedures Procedures (including critical care time)  Medications Ordered in UC Medications - No data to display  Initial Impression / Assessment and Plan / UC Course  I have reviewed the triage vital signs and the nursing notes.  Pertinent labs & imaging results that were available during my care of the patient were reviewed by me and considered in my medical decision making (see chart for details).    Viral illness.  COVID and Flu pending.  Discussed symptomatic treatment including Tylenol or ibuprofen, rest, hydration.  Instructed patient to follow up with PCP if symptoms are not improving.  She agrees to plan of care.   Final Clinical  Impressions(s) / UC Diagnoses   Final diagnoses:  Viral illness     Discharge Instructions       Your COVID and Flu tests are pending.    Take Tylenol or ibuprofen as needed for fever or discomfort.  Rest and keep yourself hydrated.    Follow-up with your primary care provider if your symptoms are not improving.         ED Prescriptions   None    PDMP not reviewed this encounter.   Mickie Bail, NP 08/26/22 1006

## 2022-08-26 NOTE — Discharge Instructions (Addendum)
Your COVID and Flu tests are pending.    Take Tylenol or ibuprofen as needed for fever or discomfort.  Rest and keep yourself hydrated.    Follow-up with your primary care provider if your symptoms are not improving.     

## 2022-09-15 ENCOUNTER — Ambulatory Visit
Admission: EM | Admit: 2022-09-15 | Discharge: 2022-09-15 | Disposition: A | Discharge status: Home / Self Care | Payer: BLUE CROSS/BLUE SHIELD | Attending: Urgent Care | Admitting: Urgent Care

## 2022-09-15 DIAGNOSIS — J028 Acute pharyngitis due to other specified organisms: Secondary | ICD-10-CM

## 2022-09-15 DIAGNOSIS — B9689 Other specified bacterial agents as the cause of diseases classified elsewhere: Secondary | ICD-10-CM | POA: Diagnosis not present

## 2022-09-15 LAB — POCT RAPID STREP A (OFFICE): Rapid Strep A Screen: POSITIVE — AB

## 2022-09-15 MED ORDER — AMOXICILLIN 500 MG PO CAPS: 500.0000 mg | ORAL_CAPSULE | Freq: Two times a day (BID) | ORAL | 0 refills | Status: AC

## 2022-09-15 NOTE — Discharge Instructions (Signed)
Follow up here or with your primary care provider if your symptoms are worsening or not improving with treatment.     

## 2022-09-15 NOTE — ED Triage Notes (Signed)
PT. Presents to UC w/ c/o a sore throat that has affected her eating and drinking and bilateral ear pain for the past 2 days.

## 2022-09-15 NOTE — ED Provider Notes (Signed)
Renaldo Fiddler    CSN: [id number] Arrival date & time: 09/15/22  0737      History   Chief Complaint No chief complaint on file.   HPI Jamie Riggs is a 36 y.o. female.   HPI  This to urgent care with complaint of sore throat affecting her eating and drinking.  She also endorses bilateral ear pain x 2 days.  Sore throat since 2 days.  His other respiratory symptoms.  Endorses pain in her right jaw as well.  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 Authorizing Provider  citalopram (CELEXA) 10 MG tablet SMARTSIG:1 Tablet(s) By Mouth Every Evening 08/04/22   [provider]  escitalopram (LEXAPRO) 5 MG tablet Take 5 mg by mouth daily. Patient not taking: Reported on 02/06/2022 12/14/21   [provider]  Miconazole Nitrate 2 % OINT Apply 1 Application topically 2 (two) times daily. Apply until rash is gone and for 1 week thereafter 06/16/22   Domenick Gong, MD  naproxen (NAPROSYN) 500 MG tablet Take 1 tablet (500 mg total) by mouth 2  (two) times daily. 06/16/22   Domenick Gong, MD    Family History Family History  Problem Relation Age of Onset   Diabetes Mother    Hypertension Father    Diabetes Maternal Grandmother    Cancer Paternal Grandmother     Social History Social History   Tobacco Use   Smoking status: Former   Smokeless tobacco: Never  Building services engineer Use: Never used  Substance Use Topics   Alcohol use: Yes    Comment: rare   Drug use: No     Allergies   Patient has no known allergies.   Review of Systems Review of Systems   Physical Exam Triage Vital Signs ED Triage Vitals  Enc Vitals Group     BP      Pulse      Resp      Temp      Temp src      SpO2      Weight      Height      Head Circumference      Peak Flow      Pain Score  Pain Loc      Pain Edu?      Excl. in GC?    No data found.  Updated Vital Signs There were no vitals taken for this visit.  Visual Acuity Right Eye Distance:   Left Eye Distance:   Bilateral Distance:    Right Eye Near:   Left Eye Near:    Bilateral Near:     Physical Exam Vitals reviewed.  Constitutional:      Appearance: Normal appearance. She is not ill-appearing.  HENT:     Mouth/Throat:     Pharynx: Posterior oropharyngeal erythema present. No oropharyngeal exudate.  Skin:    General: Skin is warm and dry.  Neurological:     General: No focal deficit present.     Mental Status: She is alert and oriented to person, place, and time.  Psychiatric:        Mood and Affect: Mood normal.        Behavior: Behavior normal.      UC Treatments / Results  Labs (all labs ordered are listed, but only abnormal results are displayed) Labs Reviewed - No data to display  EKG   Radiology No results found.  Procedures Procedures (including critical care time)  Medications Ordered in UC Medications - No data to display  Initial Impression / Assessment and Plan / UC Course  I have reviewed the triage vital  signs and the nursing notes.  Pertinent labs & imaging results that were available during my care of the patient were reviewed by me and considered in my medical decision making (see chart for details).   Patient is afebrile here without recent antipyretics. Satting well on room air. Overall is well appearing, well hydrated, without respiratory distress.  Pharynx is very erythematous.  There are no peritonsillar exudates visible.  Rapid strep is positive.  Will treat with amoxicillin.   Final Clinical Impressions(s) / UC Diagnoses   Final diagnoses:  None   Discharge Instructions   None    ED Prescriptions   None    PDMP not reviewed this encounter.   Charma Igo, FNP 09/15/22 1020

## 2022-09-29 ENCOUNTER — Ambulatory Visit: Payer: Self-pay | Admitting: Physician Assistant

## 2023-04-26 LAB — AMB RESULTS CONSOLE CBG: Glucose: 106

## 2023-04-27 NOTE — Progress Notes (Signed)
PCP not listed. Pt declined SDOH

## 2023-05-27 NOTE — Progress Notes (Signed)
Disregard note. See notation on 06/13/2023 HEALTH EQUITY SCREENING EVENT  DATE: 04/27/2023 SCREENING: B/P    124/86                  GLUCOSE : 106 PER CHART REVIEW/PCP: Per chart review, patient has been seen at Urgent Care for reasons indicated in the chart, limited information given due to being outside of the [facility]. Patient has been seen by a team of Specialist for varying reasons.  No addidtional Health Equity Team support indicated at this time.

## 2023-06-13 ENCOUNTER — Encounter: Payer: Self-pay | Admitting: *Deleted

## 2023-06-13 NOTE — Progress Notes (Unsigned)
Pt attended 04/26/2023 screening event where her b/p was 124/86 and her blood sugar was 106. At the event the pt did not identify a PCP or any SDOH barriers. However, during initial event f/u phone call with pt, pt noted had historically pursued a gastric sleeve procedure for wt loss following attempts by diet, exercise, and medication that were not successful for her. Pt states she would like a PCP in the [facility] system bc she had worked with [employer] in the past, and is interested in [place], [place] or [place] area PCP clinic. She also states she will have insurance again on 06/21/23 - BCBS, but is not sure what PCP offices it will cover. She also shared 2 of her children have newly diagnosed conditions for which she is currently working with their pediatric physicians to get them the specialty care they need. Health equity team member offered to mail local [facility] PCP practice info as well as [facility] and the community primary care clinic flyers and information on the [facility] bariatric program in which she said she was interested. Pt denies need for SDOH support at this time. Additional pt f/u to be scheduled per health equity protocol.

## 2023-07-25 ENCOUNTER — Encounter: Payer: Self-pay | Admitting: *Deleted

## 2023-07-25 NOTE — Progress Notes (Signed)
Initial 60 day event f/u notes confirm pt had future appt to establish care with new PCP (see 06/13/23 Community documentation notes). Per chart review today, pt did establish care with Dr. Flossie Buffy at [facility] with a visit to get established on 07/14/23 and she has a future visit with him on 10/14/23. Per chart review, pt received the referral she requested for [facility] weight management also. No further health equity team support scheduled at this time.

## 2023-07-26 LAB — LIPID PANEL
Cholesterol: 159 (ref 0–200)
HDL: 25 — AB (ref 35–70)
LDL Cholesterol: 78
Triglycerides: 280 — AB (ref 40–160)

## 2023-07-26 LAB — COMPREHENSIVE METABOLIC PANEL WITH GFR
Albumin: 3.3 — AB (ref 3.5–5.0)
Calcium: 8.6 — AB (ref 8.7–10.7)

## 2023-07-26 LAB — BASIC METABOLIC PANEL WITH GFR
BUN: 9 (ref 4–21)
CO2: 26 — AB (ref 13–22)
Chloride: 105 (ref 99–108)
Creatinine: 0.6 (ref 0.5–1.1)
Glucose: 154
Potassium: 4.1 meq/L (ref 3.5–5.1)
Sodium: 140 (ref 137–147)

## 2023-07-26 LAB — HEPATIC FUNCTION PANEL
ALT: 21 U/L (ref 7–35)
AST: 32 (ref 13–35)
Alkaline Phosphatase: 102 (ref 25–125)
Bilirubin, Total: 0.2

## 2023-10-11 ENCOUNTER — Encounter: Payer: Self-pay | Admitting: Hematology

## 2023-10-11 ENCOUNTER — Ambulatory Visit (HOSPITAL_BASED_OUTPATIENT_CLINIC_OR_DEPARTMENT_OTHER): Payer: No Typology Code available for payment source | Admitting: Student

## 2023-10-11 ENCOUNTER — Emergency Department: Payer: No Typology Code available for payment source

## 2023-10-11 ENCOUNTER — Encounter (HOSPITAL_BASED_OUTPATIENT_CLINIC_OR_DEPARTMENT_OTHER): Payer: Self-pay | Admitting: Student

## 2023-10-11 ENCOUNTER — Emergency Department
Admission: EM | Admit: 2023-10-11 | Discharge: 2023-10-11 | Disposition: A | Discharge status: Home / Self Care | Payer: No Typology Code available for payment source | Attending: Emergency Medicine | Admitting: Emergency Medicine

## 2023-10-11 ENCOUNTER — Other Ambulatory Visit: Payer: Self-pay

## 2023-10-11 DIAGNOSIS — M25511 Pain in right shoulder: Secondary | ICD-10-CM | POA: Diagnosis present

## 2023-10-11 DIAGNOSIS — W06XXXA Fall from bed, initial encounter: Secondary | ICD-10-CM | POA: Diagnosis not present

## 2023-10-11 MED ORDER — KETOROLAC TROMETHAMINE 30 MG/ML IJ SOLN
30.0000 mg | Freq: Once | INTRAMUSCULAR | Status: AC
  Administered 2023-10-11: 30 mg via INTRAMUSCULAR
  Filled 2023-10-11: qty 1

## 2023-10-11 MED ORDER — OXYCODONE-ACETAMINOPHEN 5-325 MG PO TABS: 1.0000 | ORAL_TABLET | Freq: Four times a day (QID) | ORAL | 0 refills | Status: AC | PRN

## 2023-10-11 MED ORDER — ACETAMINOPHEN 500 MG PO TABS
1000.0000 mg | ORAL_TABLET | Freq: Once | ORAL | Status: AC
  Administered 2023-10-11: 1000 mg via ORAL
  Filled 2023-10-11: qty 2

## 2023-10-11 NOTE — Discharge Instructions (Signed)
You were seen in the emergency department following a fall with right shoulder injury.  You had an x-ray done of the right shoulder that did not show any broken bones or signs of dislocation.  You are given information to follow-up with orthopedics.  You can get a sling for comfort for your right shoulder.  Alternate Motrin and Tylenol for pain control.  Return to the emergency department for any ongoing or worsening symptoms.  Pain control:  Ibuprofen (motrin/aleve/advil) - You can take 3 tablets (600 mg) every 6 hours as needed for pain/fever.  Acetaminophen (tylenol) - You can take 2 extra strength tablets (1000 mg) every 6 hours as needed for pain/fever.  You can alternate these medications or take them together.  Make sure you eat food/drink water when taking these medications.  You were given a prescription for narcotic pain medications.  Take only if in severe pain.  These are very addictive medications.  These medications can make you constipated.  If you need to take more than 1-2 doses, start a stool softner.  If you become constipated, take 1 capfull of MiraLAX, can repeat untill having regular bowel movements.  Keep this medication out of reach of any children.  Thank you for choosing [place] for your health care, it was my pleasure to care for you today!  Corena Herter, MD

## 2023-10-11 NOTE — ED Provider Notes (Signed)
[facility] Provider Note    Event Date/Time   First MD Initiated Contact with Patient 10/11/23 [phone]     (approximate)   History   Shoulder Pain   HPI  Jamie Riggs is a 38 y.o. female no significant past medical history who presents to the emergency department with right shoulder pain.  States that she fell out of bed not able to move the right arm.  Denies any numbness or weakness.  Denies head injury or loss of consciousness.  No neck pain.  No pain to her elbow or wrist.  Has not taken anything for pain.  No prior injury to the shoulder.  Not on anticoagulation.     Physical Exam   Triage Vital Signs: ED Triage Vitals  Encounter Vitals Group     BP 10/11/23 0853 116/76     Systolic BP Percentile --      Diastolic BP Percentile --      Pulse Rate 10/11/23 0853 (!) 56     Resp 10/11/23 0853 17     Temp 10/11/23 0853 98.3 F (36.8 C)     Temp Source 10/11/23 0853 Oral     SpO2 10/11/23 0853 100 %     Weight 10/11/23 0852 220 lb 0.3 oz (99.8 kg)     Height 10/11/23 0852 5\' 1"  (1.549 m)     Head Circumference --      Peak Flow --      Pain Score --      Pain Loc --      Pain Education --      Exclude from Growth Chart --     Most recent vital signs: Vitals:   10/11/23 0853  BP: 116/76  Pulse: (!) 56  Resp: 17  Temp: 98.3 F (36.8 C)  SpO2: 100%    Physical Exam HENT:     Head: Atraumatic.     Right Ear: External ear normal.     Left Ear: External ear normal.  Eyes:     Extraocular Movements: Extraocular movements intact.     Pupils: Pupils are equal, round, and reactive to light.  Cardiovascular:     Rate and Rhythm: Normal rate.     Comments: No tenderness to palpation to the clavicle Pulmonary:     Effort: No respiratory distress.  Abdominal:     General: There is no distension.  Musculoskeletal:        General: Tenderness present.     Cervical back: No tenderness.     Comments: Right shoulder with tenderness to  palpation.  No significant tenderness to palpation to the right elbow or forearm.  Good grip strength.  +2 radial pulses.  Sensation intact.  Full flexion extension to the wrist and hand.  Neurological:     General: No focal deficit present.     Mental Status: She is alert.      IMPRESSION / MDM / ASSESSMENT AND PLAN / ED COURSE  I reviewed the triage vital signs and the nursing notes.  Differential diagnosis including fracture, dislocation, musculoskeletal strain, rotator cuff injury   RADIOLOGY I independently reviewed imaging, my interpretation of imaging: X-ray of the right shoulder with no acute fracture or dislocation   Labs (all labs ordered are listed, but only abnormal results are displayed) Labs interpreted as -    Labs Reviewed - No data to display    Patient was given IM Toradol and Tylenol.  Some improvement of pain.  Discussed  symptomatic treatment.  Possible rotator cuff injury.  No midline cervical spine tenderness do not feel that CT scan of the cervical spine is necessary at this time.  Low suspicion for radiculopathy.  Possible rotator cuff injury or nerve impingement.  Discussed sling for comfort, patient states she will pick up at [facility]. discussed close follow-up with orthopedic surgery as an outpatient and discussed return precautions for any ongoing or worsening symptoms.   PROCEDURES:  Critical Care performed: No  Procedures  Patient's presentation is most consistent with acute illness / injury with system symptoms.   MEDICATIONS ORDERED IN ED: Medications  ketorolac (TORADOL) 30 MG/ML injection 30 mg (30 mg Intramuscular Given 10/11/23 0937)  acetaminophen (TYLENOL) tablet 1,000 mg (1,000 mg Oral Given 10/11/23 0937)    FINAL CLINICAL IMPRESSION(S) / ED DIAGNOSES   Final diagnoses:  Acute pain of right shoulder     Rx / DC Orders   ED Discharge Orders          Ordered    oxyCODONE-acetaminophen (PERCOCET) 5-325 MG tablet  Every 6 hours  PRN        10/11/23 1002             Note:  This document was prepared using Dragon voice recognition software and may include unintentional dictation errors.   Corena Herter, MD 10/11/23 1004

## 2023-10-11 NOTE — Progress Notes (Signed)
Chief Complaint: Left shoulder pain     History of Present Illness:    Jamie Riggs is a 38 y.o. female presenting today for evaluation of right shoulder pain.  Patient states that she slipped while getting out of bed this morning and fell directly onto her right shoulder.  She did seek evaluation in the emergency department and received x-rays, IM Toradol, and Tylenol.  She has been wearing a sling.  Currently rates pain an 8/10 which is located mainly over the superior lateral shoulder.  Denies any neck pain, radiating symptoms, numbness, or tingling.  She is right-hand dominant.   Surgical History:   None  PMH/PSH/Family History/Social History/Meds/Allergies:    Past Medical History:  Diagnosis Date   Anemia    Anxiety    Ectopic pregnancy 2017   Past Surgical History:  Procedure Laterality Date   ABDOMINAL SURGERY     TUBAL LIGATION Bilateral 11/12/2017   Procedure: POST PARTUM TUBAL LIGATION;  Surgeon: Tereso Newcomer, MD;  Location: [facility];  Service: Gynecology;  Laterality: Bilateral;   WISDOM TOOTH EXTRACTION  2018   Social History   Socioeconomic History   Marital status: Legally Separated    Spouse name: Not on file   Number of children: Not on file   Years of education: Not on file   Highest education level: Not on file  Occupational History   Not on file  Tobacco Use   Smoking status: Former   Smokeless tobacco: Never  Vaping Use   Vaping status: Never Used  Substance and Sexual Activity   Alcohol use: Yes    Comment: rare   Drug use: No   Sexual activity: Yes    Partners: Male    Birth control/protection: None, Surgical  Other Topics Concern   Not on file  Social History Narrative   Not on file   Social Drivers of Health   Financial Resource Strain: Not on file  Food Insecurity: Not on file  Transportation Needs: Not on file  Physical Activity: Not on file  Stress: Not on file  Social  Connections: Unknown (02/02/2022)   Received from [facility]   Social Network    Social Network: Not on file   Family History  Problem Relation Age of Onset   Diabetes Mother    Hypertension Father    Diabetes Maternal Grandmother    Cancer Paternal Grandmother    No Known Allergies Current Outpatient Medications  Medication Sig Dispense Refill   citalopram (CELEXA) 10 MG tablet SMARTSIG:1 Tablet(s) By Mouth Every Evening     escitalopram (LEXAPRO) 5 MG tablet Take 5 mg by mouth daily. (Patient not taking: Reported on 02/06/2022)     Miconazole Nitrate 2 % OINT Apply 1 Application topically 2 (two) times daily. Apply until rash is gone and for 1 week thereafter 56 g 0   naproxen (NAPROSYN) 500 MG tablet Take 1 tablet (500 mg total) by mouth 2 (two) times daily. 20 tablet 0   oxyCODONE-acetaminophen (PERCOCET) 5-325 MG tablet Take 1 tablet by mouth every 6 (six) hours as needed for up to 3 days for severe pain (pain score 7-10). 8 tablet 0   No current facility-administered medications for this visit.   DG Shoulder Right Result Date: 10/11/2023 CLINICAL DATA:  Fall.  Trauma  to the right shoulder. EXAM: RIGHT SHOULDER - 2+ VIEW COMPARISON:  None Available. FINDINGS: There is no evidence of fracture or dislocation. There is no evidence of arthropathy or other focal bone abnormality. Soft tissues are unremarkable. IMPRESSION: Negative. Electronically Signed   By: Elgie Collard M.D.   On: 10/11/2023 09:43    Review of Systems:   A ROS was performed including pertinent positives and negatives as documented in the HPI.  Physical Exam :   Constitutional: NAD and appears stated age Neurological: Alert and oriented Psych: Appropriate affect and cooperative Last menstrual period 10/04/2023.   Comprehensive Musculoskeletal Exam:    Patient's right arm is immobilized in a sling.  Tenderness to palpation over the [facility] joint and lateral deltoid.  AC joint pain with cross body  adduction.  Active shoulder range of motion to 120 degrees forward flexion and 90 degrees abduction with significant discomfort.  Radial pulse 2+ with brisk capillary refill to all 5 fingers.  Distal neurosensory exam intact.  Imaging:   Xray review from ED today (left shoulder 3 views): Negative for bony abnormality   I personally reviewed and interpreted the radiographs.   Assessment:   38 y.o. female with an acute right shoulder injury after a direct fall onto her right side this morning.  X-rays taken in the emergency department are negative for fracture or dislocation.  Given the severity of her symptoms as well as difficulty with range of motion I would like to obtain a stat MRI for further assessment, particularly to evaluate for a rotator cuff and/or AC joint injury.  Will have her remain in the sling for comfort.  Discussed that she can alternate ibuprofen and Tylenol for pain.  She was given Percocet by the ED which she can use as needed for breakthrough pain.  Will plan to have her follow-up shortly after MRI for review.  Plan :    - Obtain stat MRI of the left shoulder and return to clinic for review and treatment discussion     I personally saw and evaluated the patient, and participated in the management and treatment plan.  Hazle Nordmann, PA-C Orthopedics

## 2023-10-11 NOTE — ED Triage Notes (Signed)
Pt here with right shoulder pain. Pt states she thinks she dislocated it. Pt fell out of the bed and landed on her shoulder, pt also states she hit her head but denies LOC. Pt states she can still move her fingers but cannot raise her arm above her head. Pt ambulatory to triage.

## 2023-10-13 ENCOUNTER — Ambulatory Visit: Payer: BLUE CROSS/BLUE SHIELD

## 2023-10-15 ENCOUNTER — Other Ambulatory Visit: Payer: No Typology Code available for payment source

## 2023-10-17 ENCOUNTER — Other Ambulatory Visit (HOSPITAL_BASED_OUTPATIENT_CLINIC_OR_DEPARTMENT_OTHER): Payer: Self-pay | Admitting: Student

## 2023-10-17 ENCOUNTER — Telehealth: Payer: Self-pay | Admitting: Student

## 2023-10-17 ENCOUNTER — Encounter (HOSPITAL_BASED_OUTPATIENT_CLINIC_OR_DEPARTMENT_OTHER): Payer: Self-pay

## 2023-10-17 DIAGNOSIS — M25511 Pain in right shoulder: Secondary | ICD-10-CM

## 2023-10-17 MED ORDER — TRAMADOL HCL 50 MG PO TABS: 50.0000 mg | ORAL_TABLET | Freq: Four times a day (QID) | ORAL | 0 refills | Status: AC | PRN

## 2023-10-17 NOTE — Telephone Encounter (Signed)
Patient called. Would like a refill on hydrocodone.

## 2023-10-18 ENCOUNTER — Encounter (HOSPITAL_BASED_OUTPATIENT_CLINIC_OR_DEPARTMENT_OTHER): Payer: Self-pay

## 2023-10-19 ENCOUNTER — Ambulatory Visit
Admission: RE | Admit: 2023-10-19 | Discharge: 2023-10-19 | Disposition: A | Discharge status: Home / Self Care | Payer: No Typology Code available for payment source | Source: Ambulatory Visit | Attending: Student | Admitting: Student

## 2023-10-19 ENCOUNTER — Ambulatory Visit: Payer: No Typology Code available for payment source

## 2023-10-19 DIAGNOSIS — M25511 Pain in right shoulder: Secondary | ICD-10-CM

## 2023-10-26 ENCOUNTER — Encounter (HOSPITAL_BASED_OUTPATIENT_CLINIC_OR_DEPARTMENT_OTHER): Payer: Self-pay | Admitting: Student

## 2023-10-26 ENCOUNTER — Ambulatory Visit (HOSPITAL_BASED_OUTPATIENT_CLINIC_OR_DEPARTMENT_OTHER): Payer: No Typology Code available for payment source | Admitting: Student

## 2023-10-26 DIAGNOSIS — S4991XA Unspecified injury of right shoulder and upper arm, initial encounter: Secondary | ICD-10-CM | POA: Diagnosis not present

## 2023-10-26 MED ORDER — MELOXICAM 15 MG PO TABS: 15.0000 mg | ORAL_TABLET | Freq: Every day | ORAL | 0 refills | Status: AC

## 2023-10-26 NOTE — Progress Notes (Signed)
 Chief Complaint: Right shoulder pain     History of Present Illness:   10/26/23: Patient is presenting today for follow-up of right shoulder pain and MRI review.  Overall she states that her shoulder is feeling a lot better.  She does continue to have pain with certain movements such as reaching across her body and turning doorknobs.  Has been using heat and IcyHot.   Jamie Riggs is a 38 y.o. female presenting today for evaluation of right shoulder pain.  Patient states that she slipped while getting out of bed this morning and fell directly onto her right shoulder.  She did seek evaluation in the emergency department and received x-rays, IM Toradol , and Tylenol .  She has been wearing a sling.  Currently rates pain an 8/10 which is located mainly over the superior lateral shoulder.  Denies any neck pain, radiating symptoms, numbness, or tingling.  She is right-hand dominant.   Surgical History:   None  PMH/PSH/Family History/Social History/Meds/Allergies:    Past Medical History:  Diagnosis Date   Anemia    Anxiety    Ectopic pregnancy 2017   Past Surgical History:  Procedure Laterality Date   ABDOMINAL SURGERY     TUBAL LIGATION Bilateral 11/12/2017   Procedure: POST PARTUM TUBAL LIGATION;  Surgeon: Herchel Gloris LABOR, MD;  Location: [facility];  Service: Gynecology;  Laterality: Bilateral;   WISDOM TOOTH EXTRACTION  2018   Social History   Socioeconomic History   Marital status: Legally Separated    Spouse name: Not on file   Number of children: Not on file   Years of education: Not on file   Highest education level: Not on file  Occupational History   Not on file  Tobacco Use   Smoking status: Former   Smokeless tobacco: Never  Vaping Use   Vaping status: Never Used  Substance and Sexual Activity   Alcohol use: Yes    Comment: rare   Drug use: No   Sexual activity: Yes    Partners: Male    Birth  control/protection: None, Surgical  Other Topics Concern   Not on file  Social History Narrative   Not on file   Social Drivers of Health   Financial Resource Strain: Not on file  Food Insecurity: Not on file  Transportation Needs: Not on file  Physical Activity: Not on file  Stress: Not on file  Social Connections: Unknown (02/02/2022)   Received from [facility]   Social Network    Social Network: Not on file   Family History  Problem Relation Age of Onset   Diabetes Mother    Hypertension Father    Diabetes Maternal Grandmother    Cancer Paternal Grandmother    No Known Allergies Current Outpatient Medications  Medication Sig Dispense Refill   meloxicam  (MOBIC ) 15 MG tablet Take 1 tablet (15 mg total) by mouth daily for 10 days. 10 tablet 0   citalopram  (CELEXA ) 10 MG tablet SMARTSIG:1 Tablet(s) By Mouth Every Evening     escitalopram (LEXAPRO) 5 MG tablet Take 5 mg by mouth daily. (Patient not taking: Reported on 02/06/2022)     Miconazole  Nitrate 2 % OINT Apply 1 Application topically 2 (two) times daily. Apply until rash is gone and for 1 week thereafter 56 g 0  naproxen  (NAPROSYN ) 500 MG tablet Take 1 tablet (500 mg total) by mouth 2 (two) times daily. 20 tablet 0   No current facility-administered medications for this visit.   No results found.   Review of Systems:   A ROS was performed including pertinent positives and negatives as documented in the HPI.  Physical Exam :   Constitutional: NAD and appears stated age Neurological: Alert and oriented Psych: Appropriate affect and cooperative Last menstrual period 10/04/2023.   Comprehensive Musculoskeletal Exam:    To palpation of the right shoulder over the distal clavicle and AC joint.  Full active range of motion to 160 degrees flexion, 40 degrees external rotation, and internal rotation to L4.  Pain with cross body abduction.  Imaging:   MRI right shoulder: Inflammation within the [facility]  joint without evidence of fracture suggesting low-grade sprain.  No evidence of significant rotator cuff injury.   I personally reviewed and interpreted the radiographs.   Assessment:   38 y.o. female with a direct fall onto her right shoulder 2 weeks ago.  MRI does not suggest any significant tear in the rotator cuff however inflammation around the [facility] joint likely indicates a low-grade sprain.  Overall her shoulder is feeling much better and she now has full range of motion.  I have recommended a short course of NSAIDs to help reduce this inflammation as well as continuing with full active range of motion.  She can plan to return as needed.  Plan :    -Start meloxicam  15 mg daily -Return to clinic as needed     I personally saw and evaluated the patient, and participated in the management and treatment plan.  Leonce Reveal, PA-C Orthopedics

## 2024-01-10 ENCOUNTER — Telehealth: Payer: Self-pay

## 2024-01-10 ENCOUNTER — Encounter: Payer: Self-pay | Admitting: Student

## 2024-01-10 ENCOUNTER — Ambulatory Visit (INDEPENDENT_AMBULATORY_CARE_PROVIDER_SITE_OTHER): Admitting: Student

## 2024-01-10 VITALS — BP 104/72 | Ht 61.0 in | Wt 200.0 lb

## 2024-01-10 DIAGNOSIS — E119 Type 2 diabetes mellitus without complications: Secondary | ICD-10-CM | POA: Insufficient documentation

## 2024-01-10 DIAGNOSIS — Z7985 Long-term (current) use of injectable non-insulin antidiabetic drugs: Secondary | ICD-10-CM

## 2024-01-10 DIAGNOSIS — K8020 Calculus of gallbladder without cholecystitis without obstruction: Secondary | ICD-10-CM | POA: Insufficient documentation

## 2024-01-10 DIAGNOSIS — E611 Iron deficiency: Secondary | ICD-10-CM | POA: Insufficient documentation

## 2024-01-10 DIAGNOSIS — E1165 Type 2 diabetes mellitus with hyperglycemia: Secondary | ICD-10-CM

## 2024-01-10 DIAGNOSIS — E559 Vitamin D deficiency, unspecified: Secondary | ICD-10-CM

## 2024-01-10 DIAGNOSIS — F419 Anxiety disorder, unspecified: Secondary | ICD-10-CM

## 2024-01-10 LAB — POCT GLYCOSYLATED HEMOGLOBIN (HGB A1C): Hemoglobin A1C: 5.8 % — AB (ref 4.0–5.6)

## 2024-01-10 MED ORDER — TIRZEPATIDE 10 MG/0.5ML ~~LOC~~ SOAJ: 10.0000 mg | SUBCUTANEOUS | 3 refills | Status: DC

## 2024-01-10 MED ORDER — CITALOPRAM HYDROBROMIDE 10 MG PO TABS: 10.0000 mg | ORAL_TABLET | Freq: Every day | ORAL | 3 refills | Status: AC

## 2024-01-10 MED ORDER — ROSUVASTATIN CALCIUM 10 MG PO TABS: 10.0000 mg | ORAL_TABLET | Freq: Every day | ORAL | 1 refills | Status: DC

## 2024-01-10 MED ORDER — METFORMIN HCL ER 500 MG PO TB24: 500.0000 mg | ORAL_TABLET | Freq: Two times a day (BID) | ORAL | 3 refills | Status: DC

## 2024-01-10 NOTE — Assessment & Plan Note (Signed)
 Currently on mounjaro weight down from 220 to 200 since January this year. Discussed healthy diet and exercise, she is working out more frequently. Increasing mounjaro to 10 mg weekly.

## 2024-01-10 NOTE — Assessment & Plan Note (Signed)
 Reports history of gallstones too small to remove, occasionally has some associated epigastric pain. Continue to monitor for symptoms.

## 2024-01-10 NOTE — Telephone Encounter (Signed)
 PA DENIED

## 2024-01-10 NOTE — Assessment & Plan Note (Signed)
 Well controlled on celexa  10 mg daily, continue current medication.

## 2024-01-10 NOTE — Progress Notes (Signed)
 New Patient Office Visit  Subjective    Patient ID: Jamie Riggs, female    DOB: 1985/10/29  Age: 38 y.o. MRN: [id number]  CC:  Chief Complaint  Patient presents with   Establish Care    Wants to schedule a pap    Weight Loss    Wants to go up on mounjaro taking 7.5 now     HPI Jamie Riggs is a 46 year older person living with T2DM, obesity, anxiety, and panic attacks presents to establish care. Previously seeing Laymond Priestly NP at [facility].  Lives with 6 kids ages 6-11 years, husband. Switched insurance due to wanting to stay on mounjaro and provider at [facility] is not in network.    Diabetes Taking metformin  500 mg daily and on mounjaro 7.5 mg weekly.  Some diarrhea with metformin  and taking daily instead of twice daily. Denies symptoms of low sugars.   Obesity  Has lost about 20 pounds on mounjaro. Some weight gain lately due to not eating well due to being busy with children. Does go to gym when able to 2-3 hours a day, but this is sporadic due to being buy with the kids. Has considered bariatric surgery in the past but wanted to try mounjaro first.   Anxiety On celexa  10 mg and feels anxiety is well controlled No trouble sleeping or panic attacks. Reports history PTSD from prior  relationship, not currently seeing a therapist.   Outpatient Encounter Medications as of 01/10/2024  Medication Sig   metFORMIN  (GLUCOPHAGE -XR) 500 MG 24 hr tablet Take 1 tablet (500 mg total) by mouth 2 (two) times daily with a meal.   tirzepatide (MOUNJARO) 10 MG/0.5ML Pen Inject 10 mg into the skin once a week.   [DISCONTINUED] metFORMIN  (GLUCOPHAGE ) 500 MG tablet TAKE 1 TABLET (500MG  TOTAL) BY MOUTH IN THE MORNING AND TAKE 1 TABLET IN THE EVENING WITH MEALS   [DISCONTINUED] MOUNJARO 7.5 MG/0.5ML Pen SMARTSIG:7.5 Milligram(s) SUB-Q Once a Week   [DISCONTINUED] rosuvastatin  (CRESTOR ) 10 MG tablet Take 1 tablet (10 mg total) by mouth daily.   citalopram  (CELEXA ) 10 MG tablet Take 1 tablet (10 mg  total) by mouth daily.   [DISCONTINUED] citalopram  (CELEXA ) 10 MG tablet SMARTSIG:1 Tablet(s) By Mouth Every Evening   [DISCONTINUED] escitalopram (LEXAPRO) 5 MG tablet Take 5 mg by mouth daily. (Patient not taking: Reported on 02/06/2022)   [DISCONTINUED] Miconazole  Nitrate 2 % OINT Apply 1 Application topically 2 (two) times daily. Apply until rash is gone and for 1 week thereafter   [DISCONTINUED] naproxen  (NAPROSYN ) 500 MG tablet Take 1 tablet (500 mg total) by mouth 2 (two) times daily.   No facility-administered encounter medications on file as of 01/10/2024.    Past Medical History:  Diagnosis Date   Anemia    Anxiety    Diabetes mellitus without complication (HCC)    Ectopic pregnancy 2017    Past Surgical History:  Procedure Laterality Date   ABDOMINAL SURGERY     TUBAL LIGATION Bilateral 11/12/2017   Procedure: POST PARTUM TUBAL LIGATION;  Surgeon: Julianne Octave, MD;  Location: [facility];  Service: Gynecology;  Laterality: Bilateral;   WISDOM TOOTH EXTRACTION  2018    Family History  Problem Relation Age of Onset   Diabetes Mother    Hypertension Father    Diabetes Maternal Grandmother    Cancer Paternal Grandmother     Social History   Socioeconomic History   Marital status: Legally Separated    Spouse name: Not on  file   Number of children: Not on file   Years of education: Not on file   Highest education level: Not on file  Occupational History   Not on file  Tobacco Use   Smoking status: Former   Smokeless tobacco: Never  Vaping Use   Vaping status: Never Used  Substance and Sexual Activity   Alcohol use: Yes    Comment: rare   Drug use: No   Sexual activity: Yes    Partners: Male    Birth control/protection: None, Surgical  Other Topics Concern   Not on file  Social History Narrative   Not on file   Social Drivers of Health   Financial Resource Strain: Not on file  Food Insecurity: No Food Insecurity (01/10/2024)   Hunger Vital  Sign    Worried About Running Out of Food in the Last Year: Never true    Ran Out of Food in the Last Year: Never true  Transportation Needs: No Transportation Needs (01/10/2024)   PRAPARE - Transportation    Lack of Transportation (Medical): No    Lack of Transportation (Non-Medical): No  Physical Activity: Not on file  Stress: Not on file  Social Connections: Unknown (02/02/2022)   [facility] from [facility], [facility]   Social Network    Social Network: Not on file  Intimate Partner Violence: Not At Risk (01/10/2024)   Humiliation, Afraid, Rape, and Kick questionnaire    Fear of Current or Ex-Partner: No    Emotionally Abused: No    Physically Abused: No    Sexually Abused: No    ROS Refer to HPI    Objective   BP 104/72   Ht 5\' 1"  (1.549 m)   Wt 200 lb (90.7 kg)   LMP 01/05/2024 (Approximate)   BMI 37.79 kg/m   Physical Exam Constitutional:      Appearance: Normal appearance. She is obese.  HENT:     Mouth/Throat:     Mouth: Mucous membranes are moist.     Pharynx: Oropharynx is clear.  Eyes:     Extraocular Movements: Extraocular movements intact.     Pupils: Pupils are equal, round, and reactive to light.  Cardiovascular:     Rate and Rhythm: Normal rate and regular rhythm.     Pulses: Normal pulses.     Heart sounds: No murmur heard. Pulmonary:     Effort: Pulmonary effort is normal.     Breath sounds: Normal breath sounds. No rhonchi or rales.  Abdominal:     General: Abdomen is flat. Bowel sounds are normal. There is no distension.     Palpations: Abdomen is soft.     Tenderness: There is no abdominal tenderness.  Musculoskeletal:        General: Normal range of motion.     Right lower leg: No edema.     Left lower leg: No edema.  Skin:    General: Skin is warm and dry.     Capillary Refill: Capillary refill takes less than 2 seconds.  Neurological:     General: No focal deficit present.     Mental Status: She is alert and oriented to person,  place, and time.  Psychiatric:        Mood and Affect: Mood normal.        Behavior: Behavior normal.        01/10/2024   10:19 AM  Depression screen PHQ 2/9  Decreased Interest 0  Down, Depressed, Hopeless 0  PHQ -  2 Score 0  Altered sleeping 1  Tired, decreased energy 1  Change in appetite 0  Feeling bad or failure about yourself  0  Trouble concentrating 0  Moving slowly or fidgety/restless 0  Suicidal thoughts 0  PHQ-9 Score 2  Difficult doing work/chores Not difficult at all      01/10/2024   10:19 AM  GAD 7 : Generalized Anxiety Score  Nervous, Anxious, on Edge 1  Control/stop worrying 0  Worry too much - different things 0  Trouble relaxing 1  Restless 1  Easily annoyed or irritable 1  Afraid - awful might happen 0  Total GAD 7 Score 4  Anxiety Difficulty Not difficult at all    Last CBC Lab Results  Component Value Date   WBC 12.2 (H) 11/11/2017   HGB 11.9 (L) 07/18/2018   HCT 35.0 (L) 07/18/2018   MCV 77.0 (L) 11/11/2017   MCH 23.0 (L) 11/11/2017   RDW 19.3 (H) 11/11/2017   PLT 356 11/11/2017   Last metabolic panel Lab Results  Component Value Date   GLUCOSE 116 (H) 07/18/2018   NA 140 07/26/2023   K 4.1 07/26/2023   CL 105 07/26/2023   CO2 26 (A) 07/26/2023   BUN 9 07/26/2023   CREATININE 0.6 07/26/2023   GFRNONAA >60 10/12/2017   CALCIUM  8.6 (A) 07/26/2023   PROT 6.5 10/12/2017   ALBUMIN 3.3 (A) 07/26/2023   BILITOT 0.3 10/12/2017   ALKPHOS 102 07/26/2023   AST 32 07/26/2023   ALT 21 07/26/2023   ANIONGAP 7 10/12/2017   Last lipids Lab Results  Component Value Date   CHOL 159 07/26/2023   HDL 25 (A) 07/26/2023   LDLCALC 78 07/26/2023   TRIG 280 (A) 07/26/2023   Last hemoglobin A1c Lab Results  Component Value Date   HGBA1C 5.6 05/05/2017      Assessment & Plan:  Type 2 diabetes mellitus with hyperglycemia, without long-term current use of insulin (HCC) Assessment & Plan: A1c is well controlled at 5.8% today. Current  medications are metformin  500 mg twice daily and mounjaro 7.5 mg weekly. Switch metformin  to XR due to mild diarrhea. Urine microalbumin today. Discussed getting diabetic eye exam yearly.   Orders: -     POCT glycosylated hemoglobin (Hb A1C) -     metFORMIN  HCl ER; Take 1 tablet (500 mg total) by mouth 2 (two) times daily with a meal.  Dispense: 180 tablet; Refill: 3 -     Tirzepatide; Inject 10 mg into the skin once a week.  Dispense: 6 mL; Refill: 3 -     Microalbumin / creatinine urine ratio  Long-term current use of injectable noninsulin antidiabetic medication  Morbid obesity (HCC) complicated by T2DM Assessment & Plan: Currently on mounjaro weight down from 220 to 200 since January this year. Discussed healthy diet and exercise, she is working out more frequently. Increasing mounjaro to 10 mg weekly.    Calculus of gallbladder without cholecystitis without obstruction Assessment & Plan: Reports history of gallstones too small to remove, occasionally has some associated epigastric pain. Continue to monitor for symptoms.     Anxiety Assessment & Plan: Well controlled on celexa  10 mg daily, continue current medication.   Orders: -     Citalopram  Hydrobromide; Take 1 tablet (10 mg total) by mouth daily.  Dispense: 90 tablet; Refill: 3  Vitamin D deficiency Assessment & Plan: 07/26/2023 with vitamin D of 22.6. Recommended 600-800 iu D2/D3 daily and repeat labs in 3-4 months.  Iron  deficiency Assessment & Plan: Ferritin 29.3 with iron  55, TIBC 310, and iron  saturation 18 on labs from 07/26/2023. No anemia on CBC at that times. She takes oral iron  over the counter but irregularly. Recommended she take this daily or every other day. Repeat iron  studies at next visit.      Return in about 3 months (around 04/10/2024) for iron , vitamin D, physical/PAP.   Barnetta Liberty, MD

## 2024-01-10 NOTE — Assessment & Plan Note (Addendum)
 A1c is well controlled at 5.8% today. Current medications are metformin  500 mg twice daily and mounjaro 7.5 mg weekly. Switch metformin  to XR due to mild diarrhea. Urine microalbumin today. Discussed getting diabetic eye exam yearly.

## 2024-01-10 NOTE — Telephone Encounter (Signed)
 PA completed for Mounjaro 10MG /0.5ML auto-injectors on [email or web address]  (Key: [id number]) Rx #: [id number]  Awaiting outcome.

## 2024-01-10 NOTE — Assessment & Plan Note (Signed)
 Ferritin 29.3 with iron  55, TIBC 310, and iron  saturation 18 on labs from 07/26/2023. No anemia on CBC at that times. She takes oral iron  over the counter but irregularly. Recommended she take this daily or every other day. Repeat iron  studies at next visit.

## 2024-01-10 NOTE — Patient Instructions (Addendum)
 Increase mounjaro 10 mg weekly  Please start taking D3 600-800 iu daily for vitamin D  Please start taking and iron  supplement daily or every other day if having constipation. Can continue using the prenatal with iron  or try over the counter gentle iron    Follow up in 3-4 months

## 2024-01-10 NOTE — Assessment & Plan Note (Addendum)
 07/26/2023 with vitamin D of 22.6. Recommended 600-800 iu D2/D3 daily and repeat labs in 3-4 months.

## 2024-01-11 ENCOUNTER — Other Ambulatory Visit: Payer: Self-pay | Admitting: Student

## 2024-01-11 ENCOUNTER — Telehealth: Payer: Self-pay

## 2024-01-11 DIAGNOSIS — E1165 Type 2 diabetes mellitus with hyperglycemia: Secondary | ICD-10-CM

## 2024-01-11 MED ORDER — TIRZEPATIDE 7.5 MG/0.5ML ~~LOC~~ SOAJ: 7.5000 mg | SUBCUTANEOUS | 1 refills | Status: DC

## 2024-01-11 NOTE — Telephone Encounter (Signed)
 Second PA submitted, included a snapshot of her A1C result from 01/10/24. Awaiting the outcome.

## 2024-01-11 NOTE — Telephone Encounter (Signed)
 PA denied for mounjaro 10 mg weekly will send refill for prior dose of 7.5 mg weekly

## 2024-01-11 NOTE — Telephone Encounter (Signed)
 PA completed for Mounjaro 7.5MG /0.5ML auto-injectors on [email or web address]  (Key: [id number]) Rx #: [id number]  Awaiting outcome.

## 2024-01-16 ENCOUNTER — Other Ambulatory Visit: Payer: Self-pay | Admitting: Student

## 2024-01-16 MED ORDER — TIRZEPATIDE 10 MG/0.5ML ~~LOC~~ SOAJ: 10.0000 mg | SUBCUTANEOUS | 3 refills | Status: DC

## 2024-01-16 NOTE — Telephone Encounter (Signed)
 PA Approved.  Mounjaro 10 MG/0.5 ML SOLN auto-injector is approved from 01/11/2024 to 01/10/2025. All strengths of the drug are approved.

## 2024-01-16 NOTE — Telephone Encounter (Signed)
 Thank you, sent mounjaro 10 mg weekly to [facility]

## 2024-04-10 ENCOUNTER — Encounter: Payer: Self-pay | Admitting: Student

## 2024-04-10 ENCOUNTER — Ambulatory Visit: Admitting: Student

## 2024-05-16 NOTE — Progress Notes (Signed)
 Subjective Patient ID: Jamie Riggs is a 38 y.o. female.    38 year old female presents to clinic complaining of left thumb pain for the past 2 months.  Patient reports that her finger began hurting with no known injury and has waxed and waned over the past 2 months.  She describes the pain as aching and worse with movement and appears to be some better with rest.  At times she reports 5 out of 10 pain with difficulty gripping and lifting objects.  Currently she has no pain and is feeling well.  No other complaints or injuries to note.   History provided by:  Patient   Review of Systems  Constitutional:  Negative for chills, fatigue and fever.  Gastrointestinal:  Negative for abdominal pain, diarrhea, nausea and vomiting.  Musculoskeletal:  Negative for back pain, neck pain and neck stiffness.       Left thumb pain  Skin:  Negative for color change, pallor, rash and wound.  Neurological:  Negative for weakness, numbness and headaches.    Patient History  Allergies: No Known Allergies  Past Medical History:  Diagnosis Date  . Anxiety   . Diabetes mellitus    History reviewed. No pertinent surgical history. Social History   Socioeconomic History  . Marital status: Married    Spouse name: Not on file  . Number of children: Not on file  . Years of education: Not on file  . Highest education level: Not on file  Occupational History  . Not on file  Tobacco Use  . Smoking status: Never  . Smokeless tobacco: Never  Substance and Sexual Activity  . Alcohol use: Not on file  . Drug use: Not on file  . Sexual activity: Not on file  Other Topics Concern  . Not on file  Social History Narrative  . Not on file   History reviewed. No pertinent family history. Current Outpatient Medications on File Prior to Visit  Medication Sig Dispense Refill  . metFORMIN  XR (Glucophage -XR) 500 MG 24 hr tablet TAKE 1 TABLET BY MOUTH 2 TIMES DAILY WITH A MEAL.    . Mounjaro  5 MG/0.5ML  solution auto-injector INJECT 5 MG UNDER THE SKIN EVERY SEVEN (7) DAYS.    . [DISCONTINUED] benzonatate  (Tessalon  Perles) 100 MG capsule T 1-2 tab po tid prn (Patient not taking: Reported on 05/16/2024) 20 capsule 0  . [DISCONTINUED] citalopram  (CeleXA ) 10 MG tablet Take 10 mg by mouth 1 (one) time each day. (Patient not taking: Reported on 05/16/2024)    . [DISCONTINUED] ipratropium (Atrovent) 0.06 % nasal spray Administer 2 sprays into each nostril in the morning and 2 sprays at noon and 2 sprays in the evening and 2 sprays before bedtime. (Patient not taking: Reported on 05/16/2024) 10 mL 0   No current facility-administered medications on file prior to visit.    Objective  Vitals:   05/16/24 1256  BP: 124/85  Pulse: 75  Resp: 19  Temp: 36.6 C (97.9 F)  TempSrc: Tympanic  SpO2: 97%  Weight: 92.1 kg  Height: 5' 1  PainSc: 0-No pain     Physical Exam  GENERAL APPEARANCE: afebrile, non-toxic and not acutely ill-appearing in NAD. Patient is speaking in full sentences with no increased work of breathing. EYES: EOM intact, PERRLA, with no conjunctival injection noted. HEART: RRR, S1 and S2 present with no obvious murmur noted. No rubs, gallops or clicks noted. LUNGS: CTA bilaterally with no wheezing, ronchi, rales or crackles noted.  SKIN: Warm, dry with  no obvious rash present. Peripheral pulses are present and bounding. Adequate peripheral capillary refill is noted. MSK: No tenderness noted to the left thumb. ROM is intact with weak grip noted when compared to the right. NEURO: Alert and oriented x 3. No motor or sensory deficits noted to the left hand and left thumb.   Results for orders placed or performed in visit on 05/16/24  XR hand 3+ views left   Narrative   Examination X-Ray 3  Views of the  left hand   Comparison: None provided.   Findings Normal osseous alignment. Normal joint spaces are maintained. No fracture is radiographically apparent. No evidence of an  aggressive osseous lesion. No degenerative change.     Impression   1. No radiographically apparent acute osseous injury or malalignment.  Electronically signed by: Noal I. Shiela, M.D. on 05/16/2024 14:08:21      Procedures MDM:     1 Acute complicated illness or injury     Explanation of Medical Decision Making and variances from expected care:    Advised the patient to rest, ice and use OTC meds for pain. Meds as ordered. X-rays discussed with the patient.  Advised if pain persist or worsens to follow up with [facility] clinic. F/U with PCP in 2-3 days for re-check  F/U as needed.    Assessment requiring historian other than patient: No     Independent visualization of image, tracing, or test: No     Discussion of management with another provider: No     Risk:: Moderate         Assessment/Plan Diagnoses and all orders for this visit:  Tendinitis of thumb -     diclofenac (Voltaren) 75 MG EC tablet; Take 1 tablet (75 mg total) by mouth in the morning and 1 tablet (75 mg total) in the evening. Do all this for 14 days. Do not crush, chew, or split..  Pain of left thumb -     XR hand 3+ views left     Disposition Status: Home  Patient Instructions  Advised the patient to rest, ice and use OTC meds for pain. X-rays discussed with the patient.  Advised if pain persist or worsens to follow up with [facility] clinic. F/U with PCP in 2-3 days for re-check  F/U as needed.  [facility] [address] [place], [place] [address]  [phone]   Progress note signed by Fairy Jenny, PA on 05/16/24 at  2:11 PM

## 2024-07-05 ENCOUNTER — Other Ambulatory Visit: Payer: Self-pay | Admitting: Student

## 2024-07-06 NOTE — Telephone Encounter (Signed)
 Discontinued on 01/10/24.  Requested Prescriptions  Pending Prescriptions Disp Refills   rosuvastatin  (CRESTOR ) 10 MG tablet [Pharmacy Med Name: ROSUVASTATIN  CALCIUM  10 MG TAB] 90 tablet 1    Sig: TAKE 1 TABLET BY MOUTH EVERY DAY     Cardiovascular:  Antilipid - Statins 2 Failed - 07/06/2024  2:11 PM      Failed - Lipid Panel in normal range within the last 12 months    Cholesterol  Date Value Ref Range Status  07/26/2023 159 0 - 200 Final   LDL Cholesterol  Date Value Ref Range Status  07/26/2023 78  Final   HDL  Date Value Ref Range Status  07/26/2023 25 (A) 35 - 70 Final   Triglycerides  Date Value Ref Range Status  07/26/2023 280 (A) 40 - 160 Final         Passed - Cr in normal range and within 360 days    Creatinine  Date Value Ref Range Status  07/26/2023 0.6 0.5 - 1.1 Final  10/12/2017 0.60 0.60 - 1.10 mg/dL Final   Creatinine, Ser  Date Value Ref Range Status  07/18/2018 0.60 0.44 - 1.00 mg/dL Final   Creatinine, Urine  Date Value Ref Range Status  09/26/2017 165.43 mg/dL Final         Passed - Patient is not pregnant      Passed - Valid encounter within last 12 months    Recent Outpatient Visits           5 months ago Type 2 diabetes mellitus with hyperglycemia, without long-term current use of insulin (HCC)   [facility], MD

## 2024-07-16 ENCOUNTER — Encounter: Payer: Self-pay | Admitting: Student

## 2024-07-16 ENCOUNTER — Ambulatory Visit (INDEPENDENT_AMBULATORY_CARE_PROVIDER_SITE_OTHER): Admitting: Student

## 2024-07-16 VITALS — BP 110/74 | HR 84 | Ht 61.0 in | Wt 207.0 lb

## 2024-07-16 DIAGNOSIS — E559 Vitamin D deficiency, unspecified: Secondary | ICD-10-CM

## 2024-07-16 DIAGNOSIS — L508 Other urticaria: Secondary | ICD-10-CM | POA: Diagnosis not present

## 2024-07-16 DIAGNOSIS — E1165 Type 2 diabetes mellitus with hyperglycemia: Secondary | ICD-10-CM

## 2024-07-16 DIAGNOSIS — Z23 Encounter for immunization: Secondary | ICD-10-CM | POA: Diagnosis not present

## 2024-07-16 DIAGNOSIS — Z7985 Long-term (current) use of injectable non-insulin antidiabetic drugs: Secondary | ICD-10-CM

## 2024-07-16 DIAGNOSIS — E611 Iron deficiency: Secondary | ICD-10-CM

## 2024-07-16 NOTE — Progress Notes (Signed)
 Established Patient Office Visit  Subjective   Patient ID: Jamie Riggs, female    DOB: 10/02/1985  Age: 38 y.o. MRN: [id number]  Chief Complaint  Patient presents with   Urticaria    [name] with medical hx listed below presents today for hives for the past 2 weeks. Pruritus typically occurs at night and last a few hours, notes hives after she starts scratching. Symptoms resolve by morning. This occurs daily. She denies, fever, chills, tongue lip swelling, dizziness/LOC, abdominal pain, N/V/D,  bruising, or joint pain. Denies recent illness, change in medications, new foods, new household products. Hx of cold induced urticaria as a teenager but has not had this lately.  This occurs every day. No new detergents, medications, recent illness.    Patient Active Problem List   Diagnosis Date Noted   Acute urticaria 07/16/2024   T2DM (type 2 diabetes mellitus) (HCC) 01/10/2024   Morbid obesity (HCC) complicated by T2DM 01/10/2024   Cholelithiasis 01/10/2024   Anxiety 01/10/2024   Vitamin D deficiency 01/10/2024   Iron  deficiency 01/10/2024   Maternal iron  deficiency anemia affecting pregnancy in third trimester, antepartum 08/26/2017      ROS Refer to HPI    Objective:     Outpatient Encounter Medications as of 07/16/2024  Medication Sig   citalopram  (CELEXA ) 10 MG tablet Take 1 tablet (10 mg total) by mouth daily.   metFORMIN  (GLUCOPHAGE -XR) 500 MG 24 hr tablet Take 1 tablet (500 mg total) by mouth 2 (two) times daily with a meal.   rosuvastatin  (CRESTOR ) 10 MG tablet Take 10 mg by mouth daily.   tirzepatide  (MOUNJARO ) 10 MG/0.5ML Pen Inject 10 mg into the skin once a week.   No facility-administered encounter medications on file as of 07/16/2024.    BP 110/74   Pulse 84   Ht 5' 1 (1.549 m)   Wt 207 lb (93.9 kg)   LMP 07/09/2024 (Approximate)   SpO2 95%   BMI 39.11 kg/m  BP Readings from Last 3 Encounters:  07/16/24 110/74  01/10/24 104/72   10/11/23 116/76    Physical Exam Constitutional:      Appearance: Normal appearance.  HENT:     Head: Normocephalic and atraumatic.     Mouth/Throat:     Mouth: Mucous membranes are moist.     Pharynx: Oropharynx is clear.  Cardiovascular:     Rate and Rhythm: Normal rate and regular rhythm.  Pulmonary:     Effort: Pulmonary effort is normal.     Breath sounds: No rhonchi or rales.  Abdominal:     General: Abdomen is flat. Bowel sounds are normal. There is no distension.     Palpations: Abdomen is soft.     Tenderness: There is no abdominal tenderness.  Musculoskeletal:        General: Normal range of motion.     Right lower leg: No edema.     Left lower leg: No edema.  Lymphadenopathy:     Cervical: No cervical adenopathy.  Skin:    General: Skin is warm and dry.     Capillary Refill: Capillary refill takes less than 2 seconds.     Findings: No bruising or rash.  Neurological:     General: No focal deficit present.     Mental Status: She is alert and oriented to person, place, and time.  Psychiatric:        Mood and Affect: Mood normal.        Behavior: Behavior normal.  07/16/2024    9:49 AM 01/10/2024   10:19 AM  Depression screen PHQ 2/9  Decreased Interest 0 0  Down, Depressed, Hopeless 0 0  PHQ - 2 Score 0 0  Altered sleeping 0 1  Tired, decreased energy 1 1  Change in appetite 0 0  Feeling bad or failure about yourself  0 0  Trouble concentrating 0 0  Moving slowly or fidgety/restless 0 0  Suicidal thoughts 0 0  PHQ-9 Score 1 2  Difficult doing work/chores Not difficult at all Not difficult at all       07/16/2024    9:49 AM 01/10/2024   10:19 AM  GAD 7 : Generalized Anxiety Score  Nervous, Anxious, on Edge 0 1  Control/stop worrying 0 0  Worry too much - different things 0 0  Trouble relaxing 1 1  Restless 0 1  Easily annoyed or irritable 1 1  Afraid - awful might happen 0 0  Total GAD 7 Score 2 4  Anxiety Difficulty Not difficult at  all Not difficult at all    No results found for any visits on 07/16/24.  Last CBC Lab Results  Component Value Date   WBC 12.2 (H) 11/11/2017   HGB 11.9 (L) 07/18/2018   HCT 35.0 (L) 07/18/2018   MCV 77.0 (L) 11/11/2017   MCH 23.0 (L) 11/11/2017   RDW 19.3 (H) 11/11/2017   PLT 356 11/11/2017   Last metabolic panel Lab Results  Component Value Date   GLUCOSE 116 (H) 07/18/2018   NA 140 07/26/2023   K 4.1 07/26/2023   CL 105 07/26/2023   CO2 26 (A) 07/26/2023   BUN 9 07/26/2023   CREATININE 0.6 07/26/2023   GFRNONAA >60 10/12/2017   CALCIUM  8.6 (A) 07/26/2023   PROT 6.5 10/12/2017   ALBUMIN 3.3 (A) 07/26/2023   BILITOT 0.3 10/12/2017   ALKPHOS 102 07/26/2023   AST 32 07/26/2023   ALT 21 07/26/2023   ANIONGAP 7 10/12/2017   Last lipids Lab Results  Component Value Date   CHOL 159 07/26/2023   HDL 25 (A) 07/26/2023   LDLCALC 78 07/26/2023   TRIG 280 (A) 07/26/2023   Last hemoglobin A1c Lab Results  Component Value Date   HGBA1C 5.8 (A) 01/10/2024      The ASCVD Risk score (Arnett DK, et al., 2019) failed to calculate for the following reasons:   The 2019 ASCVD risk score is only valid for ages 49 to 46    Assessment & Plan:  Acute urticaria Assessment & Plan: Urticaria for 2 weeks. No angioedema symptoms. No obvious cause of this. No joint pain or bruising. Start zyrtec daily, she will get this over the counter. Follow up if not improving   Type 2 diabetes mellitus with hyperglycemia, without long-term current use of insulin (HCC) Assessment & Plan: Reports fast glucose around 100-120 but has not been checking lately. Needs to make eye appointment. Tolerating mounjaro  10 mg weekly well. Has not had weight loss on this would like to see if she can increase dose. Has not had any hypoglycemic sx or readings. -A1c today.  -Increase mounjaro  to 12.5 mg weekly for greater weight loss benefit.   Orders: -     Hemoglobin A1c  Vitamin D deficiency Assessment  & Plan: Vitamin D 22.6 in November 2024, is on OTC vitamin D, she cannot recall dose. Vitamin D today. She will message me current dose, will adjust based on lab results.   Orders: -  VITAMIN D 25 Hydroxy (Vit-D Deficiency, Fractures)  Iron  deficiency Assessment & Plan: Does have heavy periods, is taking prenatal most days. Last hgb  was normal but iron  studies showed low saturations. CBC and iron  panel today.   Orders: -     Iron , TIBC and Ferritin Panel -     CBC with Differential/Platelet  Encounter for immunization -     Flu vaccine trivalent PF, 6mos and older(Flulaval,Afluria,Fluarix,Fluzone)  Morbid obesity (HCC) complicated by T2DM Assessment & Plan: Check A1c today. Is tolerating mounjaro  well. Weight is up 7 lbs since last visit in April. Discussed dietary and exercise modifications which she will work on. Increase mounjaro  to 12.5 mg weekly.       Return in about 3 months (around 10/16/2024) for DM.    Harlene Saddler, MD

## 2024-07-16 NOTE — Assessment & Plan Note (Addendum)
 Does have heavy periods, is taking prenatal most days. Last hgb  was normal but iron  studies showed low saturations. CBC and iron  panel today.

## 2024-07-16 NOTE — Assessment & Plan Note (Signed)
 Check A1c today. Is tolerating mounjaro  well. Weight is up 7 lbs since last visit in April. Discussed dietary and exercise modifications which she will work on. Increase mounjaro  to 12.5 mg weekly.

## 2024-07-16 NOTE — Assessment & Plan Note (Signed)
 Vitamin D 22.6 in November 2024, is on OTC vitamin D, she cannot recall dose. Vitamin D today. She will message me current dose, will adjust based on lab results.

## 2024-07-16 NOTE — Assessment & Plan Note (Signed)
 Urticaria for 2 weeks. No angioedema symptoms. No obvious cause of this. No joint pain or bruising. Start zyrtec daily, she will get this over the counter. Follow up if not improving

## 2024-07-16 NOTE — Assessment & Plan Note (Addendum)
 Reports fast glucose around 100-120 but has not been checking lately. Needs to make eye appointment. Tolerating mounjaro  10 mg weekly well. Has not had weight loss on this would like to see if she can increase dose. Has not had any hypoglycemic sx or readings. -A1c today.  -Increase mounjaro  to 12.5 mg weekly for greater weight loss benefit.

## 2024-07-17 ENCOUNTER — Ambulatory Visit: Payer: Self-pay | Admitting: Student

## 2024-07-17 ENCOUNTER — Other Ambulatory Visit: Payer: Self-pay | Admitting: Student

## 2024-07-17 DIAGNOSIS — E1165 Type 2 diabetes mellitus with hyperglycemia: Secondary | ICD-10-CM

## 2024-07-17 LAB — CBC WITH DIFFERENTIAL/PLATELET
Basophils Absolute: 0.1 x10E3/uL (ref 0.0–0.2)
Basos: 1 %
EOS (ABSOLUTE): 0.5 x10E3/uL — ABNORMAL HIGH (ref 0.0–0.4)
Eos: 5 %
Hematocrit: 39.7 % (ref 34.0–46.6)
Hemoglobin: 13 g/dL (ref 11.1–15.9)
Immature Grans (Abs): 0 x10E3/uL (ref 0.0–0.1)
Immature Granulocytes: 0 %
Lymphocytes Absolute: 3.1 x10E3/uL (ref 0.7–3.1)
Lymphs: 35 %
MCH: 30.1 pg (ref 26.6–33.0)
MCHC: 32.7 g/dL (ref 31.5–35.7)
MCV: 92 fL (ref 79–97)
Monocytes Absolute: 0.6 x10E3/uL (ref 0.1–0.9)
Monocytes: 6 %
Neutrophils Absolute: 4.7 x10E3/uL (ref 1.4–7.0)
Neutrophils: 53 %
Platelets: 281 x10E3/uL (ref 150–450)
RBC: 4.32 x10E6/uL (ref 3.77–5.28)
RDW: 12.9 % (ref 11.7–15.4)
WBC: 8.9 x10E3/uL (ref 3.4–10.8)

## 2024-07-17 LAB — VITAMIN D 25 HYDROXY (VIT D DEFICIENCY, FRACTURES): Vit D, 25-Hydroxy: 25.8 ng/mL — ABNORMAL LOW (ref 30.0–100.0)

## 2024-07-17 LAB — IRON,TIBC AND FERRITIN PANEL
Ferritin: 47 ng/mL (ref 15–150)
Iron Saturation: 21 % (ref 15–55)
Iron: 62 ug/dL (ref 27–159)
Total Iron Binding Capacity: 291 ug/dL (ref 250–450)
UIBC: 229 ug/dL (ref 131–425)

## 2024-07-17 LAB — HEMOGLOBIN A1C
Est. average glucose Bld gHb Est-mCnc: 117 mg/dL
Hgb A1c MFr Bld: 5.7 % — ABNORMAL HIGH (ref 4.8–5.6)

## 2024-07-17 MED ORDER — TIRZEPATIDE 12.5 MG/0.5ML ~~LOC~~ SOAJ: 12.5000 mg | SUBCUTANEOUS | 1 refills | Status: DC

## 2024-07-19 NOTE — Telephone Encounter (Signed)
Please review note

## 2024-07-19 NOTE — Telephone Encounter (Signed)
 Requested medications are due for refill today.  See note  Requested medications are on the active medications list.  yes  Last refill. 07/17/2024 6mL 1 rf  Future visit scheduled.   yes  Notes to clinic.  Pharmacy comment: Alternative Requested:PLEASE SEND RX WITH A DIAGNOSIS  CODE. [place] MEDICAID NO LONGER COVERS GLP-1 MEDS FOR OBESITY.    Requested Prescriptions  Pending Prescriptions Disp Refills   MOUNJARO  12.5 MG/0.5ML Pen [Pharmacy Med Name: MOUNJARO  12.5 MG/0.5 ML PEN]  1    Sig: INJECT 12.5 MG SUBCUTANEOUSLY ONE TIME PER WEEK     Off-Protocol Failed - 07/19/2024 10:21 AM      Failed - Medication not assigned to a protocol, review manually.      Passed - Valid encounter within last 12 months    Recent Outpatient Visits           3 days ago Acute urticaria   [facility], [place], MD   6 months ago Type 2 diabetes mellitus with hyperglycemia, without long-term current use of insulin [facility])   [facility], MD

## 2024-07-20 ENCOUNTER — Other Ambulatory Visit: Payer: Self-pay | Admitting: Student

## 2024-07-20 DIAGNOSIS — E1165 Type 2 diabetes mellitus with hyperglycemia: Secondary | ICD-10-CM

## 2024-07-20 MED ORDER — MOUNJARO 12.5 MG/0.5ML ~~LOC~~ SOAJ: 12.5000 mg | SUBCUTANEOUS | 1 refills | Status: DC

## 2024-07-23 ENCOUNTER — Encounter: Payer: Self-pay | Admitting: Radiology

## 2024-07-24 MED ORDER — TIRZEPATIDE 10 MG/0.5ML ~~LOC~~ SOAJ: 10.0000 mg | SUBCUTANEOUS | 1 refills | Status: DC

## 2024-07-24 NOTE — Telephone Encounter (Signed)
 Please review

## 2024-08-21 ENCOUNTER — Encounter: Payer: Self-pay | Admitting: Student

## 2024-08-21 ENCOUNTER — Ambulatory Visit: Admitting: Student

## 2024-08-21 VITALS — BP 112/76 | HR 77 | Ht 61.0 in | Wt 204.0 lb

## 2024-08-21 DIAGNOSIS — E785 Hyperlipidemia, unspecified: Secondary | ICD-10-CM | POA: Insufficient documentation

## 2024-08-21 DIAGNOSIS — Z7985 Long-term (current) use of injectable non-insulin antidiabetic drugs: Secondary | ICD-10-CM

## 2024-08-21 DIAGNOSIS — E1165 Type 2 diabetes mellitus with hyperglycemia: Secondary | ICD-10-CM

## 2024-08-21 DIAGNOSIS — Z7984 Long term (current) use of oral hypoglycemic drugs: Secondary | ICD-10-CM | POA: Diagnosis not present

## 2024-08-21 DIAGNOSIS — E782 Mixed hyperlipidemia: Secondary | ICD-10-CM

## 2024-08-21 MED ORDER — METFORMIN HCL ER 500 MG PO TB24: 500.0000 mg | ORAL_TABLET | Freq: Two times a day (BID) | ORAL | 3 refills | Status: AC

## 2024-08-21 MED ORDER — MOUNJARO 12.5 MG/0.5ML ~~LOC~~ SOAJ: 12.5000 mg | SUBCUTANEOUS | 1 refills | Status: AC

## 2024-08-21 NOTE — Assessment & Plan Note (Signed)
 Last lipid panel cholesterol 159, HDL 25,  LDL 78, triglycerides 280, currently on rosuvastatin  10 mg but is not taking regularly. No side effects with medication. Lipid panel today. Adjust dose based on results.

## 2024-08-21 NOTE — Progress Notes (Signed)
 Established Patient Office Visit  Subjective   Patient ID: Jamie Riggs, female    DOB: 04/29/1986  Age: 38 y.o. MRN: [id number]  Chief Complaint  Patient presents with   Diabetes Mellitus    Jamie Riggs is a 38 y.o. person with medical hx listed below who presents today for DM follow up. Concerned about medications anticipates she will no longer have insurance next year. Please refer to problem based charting for further details and assessment and plan of current problem and chronic medical conditions.   Patient Active Problem List   Diagnosis Date Noted   HLD (hyperlipidemia) 08/21/2024   T2DM (type 2 diabetes mellitus) (HCC) 01/10/2024   Morbid obesity (HCC) complicated by T2DM 01/10/2024   Cholelithiasis 01/10/2024   Anxiety 01/10/2024   Vitamin D  deficiency 01/10/2024   Iron  deficiency 01/10/2024   Maternal iron  deficiency anemia affecting pregnancy in third trimester, antepartum 08/26/2017      ROS Refer to HPI    Objective:     Outpatient Encounter Medications as of 08/21/2024  Medication Sig   citalopram  (CELEXA ) 10 MG tablet Take 1 tablet (10 mg total) by mouth daily.   rosuvastatin  (CRESTOR ) 10 MG tablet Take 10 mg by mouth daily.   [DISCONTINUED] metFORMIN  (GLUCOPHAGE -XR) 500 MG 24 hr tablet Take 1 tablet (500 mg total) by mouth 2 (two) times daily with a meal.   [DISCONTINUED] MOUNJARO  12.5 MG/0.5ML Pen SMARTSIG:12.5 Milligram(s) SUB-Q Once a Week   [DISCONTINUED] tirzepatide  (MOUNJARO ) 10 MG/0.5ML Pen Inject 10 mg into the skin once a week.   metFORMIN  (GLUCOPHAGE -XR) 500 MG 24 hr tablet Take 1 tablet (500 mg total) by mouth 2 (two) times daily with a meal.   MOUNJARO  12.5 MG/0.5ML Pen Inject 12.5 mg into the skin once a week.   No facility-administered encounter medications on file as of 08/21/2024.    BP 112/76   Pulse 77   Ht 5' 1 (1.549 m)   Wt 204 lb (92.5 kg)   LMP 08/07/2024 (Approximate)   SpO2 99%   BMI 38.55 kg/m  BP Readings  from Last 3 Encounters:  08/21/24 112/76  07/16/24 110/74  01/10/24 104/72    Physical Exam Constitutional:      Appearance: Normal appearance.  HENT:     Head: Normocephalic and atraumatic.     Mouth/Throat:     Mouth: Mucous membranes are moist.     Pharynx: Oropharynx is clear.  Cardiovascular:     Rate and Rhythm: Normal rate and regular rhythm.  Pulmonary:     Effort: Pulmonary effort is normal.     Breath sounds: No rhonchi or rales.  Abdominal:     General: Abdomen is flat. Bowel sounds are normal. There is no distension.     Palpations: Abdomen is soft.     Tenderness: There is no abdominal tenderness.  Musculoskeletal:        General: Normal range of motion.     Right lower leg: No edema.     Left lower leg: No edema.  Skin:    General: Skin is warm and dry.     Capillary Refill: Capillary refill takes less than 2 seconds.  Neurological:     General: No focal deficit present.     Mental Status: She is alert and oriented to person, place, and time.  Psychiatric:        Mood and Affect: Mood normal.        Behavior: Behavior normal.  08/21/2024    1:19 PM 07/16/2024    9:49 AM 01/10/2024   10:19 AM  Depression screen PHQ 2/9  Decreased Interest 0 0 0  Down, Depressed, Hopeless 0 0 0  PHQ - 2 Score 0 0 0  Altered sleeping  0 1  Tired, decreased energy  1 1  Change in appetite  0 0  Feeling bad or failure about yourself   0 0  Trouble concentrating  0 0  Moving slowly or fidgety/restless  0 0  Suicidal thoughts  0 0  PHQ-9 Score  1  2   Difficult doing work/chores  Not difficult at all Not difficult at all     Data saved with a previous flowsheet row definition       08/21/2024    1:19 PM 07/16/2024    9:49 AM 01/10/2024   10:19 AM  GAD 7 : Generalized Anxiety Score  Nervous, Anxious, on Edge 0 0 1  Control/stop worrying 0 0 0  Worry too much - different things  0 0  Trouble relaxing  1 1  Restless  0 1  Easily annoyed or irritable  1 1   Afraid - awful might happen  0 0  Total GAD 7 Score  2 4  Anxiety Difficulty  Not difficult at all Not difficult at all    No results found for any visits on 08/21/24.  Last CBC Lab Results  Component Value Date   WBC 8.9 07/16/2024   HGB 13.0 07/16/2024   HCT 39.7 07/16/2024   MCV 92 07/16/2024   MCH 30.1 07/16/2024   RDW 12.9 07/16/2024   PLT 281 07/16/2024   Last metabolic panel Lab Results  Component Value Date   GLUCOSE 116 (H) 07/18/2018   NA 140 07/26/2023   K 4.1 07/26/2023   CL 105 07/26/2023   CO2 26 (A) 07/26/2023   BUN 9 07/26/2023   CREATININE 0.6 07/26/2023   GFRNONAA >60 10/12/2017   CALCIUM  8.6 (A) 07/26/2023   PROT 6.5 10/12/2017   ALBUMIN 3.3 (A) 07/26/2023   BILITOT 0.3 10/12/2017   ALKPHOS 102 07/26/2023   AST 32 07/26/2023   ALT 21 07/26/2023   ANIONGAP 7 10/12/2017   Last lipids Lab Results  Component Value Date   CHOL 159 07/26/2023   HDL 25 (A) 07/26/2023   LDLCALC 78 07/26/2023   TRIG 280 (A) 07/26/2023   Last hemoglobin A1c Lab Results  Component Value Date   HGBA1C 5.7 (H) 07/16/2024      Assessment & Plan:  Mixed hyperlipidemia Assessment & Plan: Last lipid panel cholesterol 159, HDL 25,  LDL 78, triglycerides 280, currently on rosuvastatin  10 mg but is not taking regularly. No side effects with medication. Lipid panel today. Adjust dose based on results.   Orders: -     Lipid panel  Type 2 diabetes mellitus with hyperglycemia, without long-term current use of insulin (HCC) Assessment & Plan: Has not taken metformin  more than 2-3 times a week as she has been doing well on mounjaro , she is tolerating increase in  mounjaro  to 12.5 mg weekly without significant GI issues. She is concerned she will lose health insurance in January. -Restart metformin  500 mg twice daily -Continue monjaro, A1c is well controlled and may do well with metformin  alone if mounjaro  become unaffordable.  -Follow up in 4 months for A1c  Orders: -      metFORMIN  HCl ER; Take 1 tablet (500 mg total) by mouth 2 (two)  times daily with a meal.  Dispense: 180 tablet; Refill: 3 -     AMB Referral [facility]  Other orders -     Mounjaro ; Inject 12.5 mg into the skin once a week.  Dispense: 6 mL; Refill: 1    Return in about 4 months (around 12/20/2024) for DM.    Harlene Saddler, MD

## 2024-08-21 NOTE — Assessment & Plan Note (Addendum)
 Has not taken metformin  more than 2-3 times a week as she has been doing well on mounjaro , she is tolerating increase in  mounjaro  to 12.5 mg weekly without significant GI issues. She is concerned she will lose health insurance in January. -Restart metformin  500 mg twice daily -Continue monjaro, A1c is well controlled and may do well with metformin  alone if mounjaro  become unaffordable.  -Follow up in 4 months for A1c

## 2024-08-30 ENCOUNTER — Telehealth: Payer: Self-pay

## 2024-08-30 NOTE — Progress Notes (Signed)
° °  Telephone encounter was:  Successful.  Complex Care Management Note Care Guide Note  08/30/2024 Name: Jamie Riggs MRN: [id number] DOB: 10-27-1985  Jamie Riggs is a 38 y.o. year old female who is a primary care patient of Lemon Harlene, MD . The community resource team was consulted for assistance with RX  SDOH screenings and interventions completed:  No        Care guide performed the following interventions: Patient provided with information about care guide support team and interviewed to confirm resource needs.Pt has no needs at this time   Follow Up Plan:  No further follow up planned at this time. The patient has been provided with needed resources.  Encounter Outcome:  Patient Visit Completed    Jon Colt [facility]  [facility] Guide, Phone: [phone] Fax: [phone] Website: [email or web address]

## 2024-10-16 ENCOUNTER — Ambulatory Visit: Admitting: Student
